# Patient Record
Sex: Female | Born: 1960
Health system: Southern US, Community
[De-identification: ages and names within clinical notes are randomized; demographics above are authoritative.]

## PROBLEM LIST (undated history)

## (undated) DIAGNOSIS — E669 Obesity, unspecified: Secondary | ICD-10-CM

## (undated) DIAGNOSIS — I10 Essential (primary) hypertension: Secondary | ICD-10-CM

## (undated) DIAGNOSIS — M1990 Unspecified osteoarthritis, unspecified site: Secondary | ICD-10-CM

## (undated) DIAGNOSIS — K219 Gastro-esophageal reflux disease without esophagitis: Secondary | ICD-10-CM

## (undated) DIAGNOSIS — G629 Polyneuropathy, unspecified: Secondary | ICD-10-CM

## (undated) DIAGNOSIS — A048 Other specified bacterial intestinal infections: Secondary | ICD-10-CM

## (undated) DIAGNOSIS — E119 Type 2 diabetes mellitus without complications: Secondary | ICD-10-CM

## (undated) HISTORY — DX: Unspecified osteoarthritis, unspecified site: M19.90

## (undated) HISTORY — DX: Gastro-esophageal reflux disease without esophagitis: K21.9

## (undated) HISTORY — DX: Other specified bacterial intestinal infections: A04.8

## (undated) HISTORY — DX: Obesity, unspecified: E66.9

## (undated) HISTORY — DX: Polyneuropathy, unspecified: G62.9

## (undated) HISTORY — DX: Type 2 diabetes mellitus without complications: E11.9

## (undated) HISTORY — DX: Essential (primary) hypertension: I10

---

## 2002-03-02 HISTORY — PX: REDUCTION MAMMAPLASTY: SUR839

## 2002-03-02 HISTORY — PX: BREAST REDUCTION SURGERY: SHX8

## 2004-03-02 HISTORY — PX: ABDOMINAL HYSTERECTOMY: SHX81

## 2011-11-18 DIAGNOSIS — G8929 Other chronic pain: Secondary | ICD-10-CM | POA: Insufficient documentation

## 2011-11-18 DIAGNOSIS — G43909 Migraine, unspecified, not intractable, without status migrainosus: Secondary | ICD-10-CM | POA: Insufficient documentation

## 2015-01-29 DIAGNOSIS — M170 Bilateral primary osteoarthritis of knee: Secondary | ICD-10-CM | POA: Insufficient documentation

## 2015-02-01 DIAGNOSIS — Z1211 Encounter for screening for malignant neoplasm of colon: Secondary | ICD-10-CM | POA: Insufficient documentation

## 2015-02-01 DIAGNOSIS — Z0000 Encounter for general adult medical examination without abnormal findings: Secondary | ICD-10-CM | POA: Insufficient documentation

## 2015-02-12 DIAGNOSIS — E7800 Pure hypercholesterolemia, unspecified: Secondary | ICD-10-CM | POA: Insufficient documentation

## 2015-09-16 DIAGNOSIS — M545 Low back pain, unspecified: Secondary | ICD-10-CM | POA: Insufficient documentation

## 2015-09-16 DIAGNOSIS — G8929 Other chronic pain: Secondary | ICD-10-CM | POA: Insufficient documentation

## 2016-06-10 DIAGNOSIS — G8929 Other chronic pain: Secondary | ICD-10-CM | POA: Insufficient documentation

## 2017-01-06 DIAGNOSIS — E1140 Type 2 diabetes mellitus with diabetic neuropathy, unspecified: Secondary | ICD-10-CM | POA: Insufficient documentation

## 2018-12-12 ENCOUNTER — Encounter: Payer: Self-pay | Admitting: Internal Medicine

## 2018-12-12 ENCOUNTER — Ambulatory Visit (INDEPENDENT_AMBULATORY_CARE_PROVIDER_SITE_OTHER): Payer: Medicare Other | Admitting: Internal Medicine

## 2018-12-12 ENCOUNTER — Other Ambulatory Visit: Payer: Self-pay

## 2018-12-12 VITALS — BP 153/78 | HR 80 | Temp 98.3°F | Ht 65.0 in | Wt 310.6 lb

## 2018-12-12 DIAGNOSIS — G629 Polyneuropathy, unspecified: Secondary | ICD-10-CM | POA: Diagnosis not present

## 2018-12-12 DIAGNOSIS — G4700 Insomnia, unspecified: Secondary | ICD-10-CM | POA: Insufficient documentation

## 2018-12-12 DIAGNOSIS — E1142 Type 2 diabetes mellitus with diabetic polyneuropathy: Secondary | ICD-10-CM

## 2018-12-12 DIAGNOSIS — I10 Essential (primary) hypertension: Secondary | ICD-10-CM

## 2018-12-12 DIAGNOSIS — M1990 Unspecified osteoarthritis, unspecified site: Secondary | ICD-10-CM | POA: Diagnosis not present

## 2018-12-12 DIAGNOSIS — F5101 Primary insomnia: Secondary | ICD-10-CM

## 2018-12-12 DIAGNOSIS — E119 Type 2 diabetes mellitus without complications: Secondary | ICD-10-CM | POA: Insufficient documentation

## 2018-12-12 LAB — POCT GLYCOSYLATED HEMOGLOBIN (HGB A1C): Hemoglobin A1C: 8.3 % — AB (ref 4.0–5.6)

## 2018-12-12 LAB — GLUCOSE, CAPILLARY: Glucose-Capillary: 135 mg/dL — ABNORMAL HIGH (ref 70–99)

## 2018-12-12 NOTE — Patient Instructions (Signed)
Thank you for allowing [place] to provide your care. Today we are checking some blood work to ensure you are on the proper medication and that your diabetes is well controlled. I will call you if we need to make any changes.   I have sent out refills for all your medications to the pharmacy. Continue to work on cutting out pop and sweet from your diet. Weight loss will be the most beneficial thing for your health. Please come back in 3 months or sooner if any issues arise.

## 2018-12-12 NOTE — Assessment & Plan Note (Addendum)
HPI:  Patient diagnoses with HTN in 2017. She has since been on Amlodipine 10 gm and Metoprolol succinate 100 mg QD. She is not having any apparent side effects from the medications. Denies orthostatic symptoms.   She brought in her paperwork today. Last lab work 1 year prior with normal renal function.   A/P: - Check BMP, CBC, UA, and protein to creatinine ratio  - Continue Amlodipine 10 mg QD and Metoprolol succinate 100 mg QD  ADDENDUM: Renal function normal. UA with trace proteinuria. Will await microalbuminuria to decide between ACE/ARB or thiazide diuretic

## 2018-12-12 NOTE — Progress Notes (Signed)
   CC: DM, HTN, Neuropathy, HLD, Obesity   HPI:  [name] is a 58 y.o. female with PMHx listed below presenting for DM, HTN, Neuropathy, HLD, Obesity. Please see the A&P for the status of the patient's chronic medical problems.  Past Medical History:  Diagnosis Date  . Arthritis   . Diabetes ([place])   . Hypertension   . Neuropathy     Past Surgical History:  Procedure Laterality Date  . ABDOMINAL HYSTERECTOMY  2006  . BREAST REDUCTION SURGERY  2004   Family History  Problem Relation Age of Onset  . Diabetes Mother   . Heart failure Mother   . CAD Mother   . Kidney disease Mother   . 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  No prior drug use.   Health Maintenance: Never had a colonoscopy. Previous FIT testing in 2017.  Cervix removed with hysterectomy. No paps since that time.  Last mammogram 2019. Normal   Review of Systems: Performed and all others negative.  Physical Exam: Vitals:   12/12/18 1526  BP: (!) 153/78  Pulse: 80  Temp: 98.3 F (36.8 C)  TempSrc: Oral  SpO2: 98%  Weight: (!) 310 lb 9.6 oz (140.9 kg)  Height: 5\' 5"  (1.651 m)   General: Obese female in no acute distress HENT: Normocephalic, atraumatic, moist mucus membranes Pulm: Good air movement with no wheezing or crackles  CV: RRR, no murmurs, no rubs  Abdomen: Active bowel sounds, soft, non-distended, no tenderness to palpation  Extremities: Pulses palpable in all extremities, no LE edema  Skin: Warm and dry  Neuro: Alert and oriented x 3  Assessment & Plan:   See Encounters Tab for problem based charting.  Patient discussed with Dr. Rebeca Alert

## 2018-12-13 ENCOUNTER — Telehealth: Payer: Self-pay | Admitting: Internal Medicine

## 2018-12-13 ENCOUNTER — Encounter: Payer: Self-pay | Admitting: Internal Medicine

## 2018-12-13 LAB — URINALYSIS, ROUTINE W REFLEX MICROSCOPIC
Bilirubin, UA: NEGATIVE
Glucose, UA: NEGATIVE
Ketones, UA: NEGATIVE
Nitrite, UA: NEGATIVE
RBC, UA: NEGATIVE
Specific Gravity, UA: 1.027 (ref 1.005–1.030)
Urobilinogen, Ur: 0.2 mg/dL (ref 0.2–1.0)
pH, UA: 5 (ref 5.0–7.5)

## 2018-12-13 LAB — CBC WITH DIFFERENTIAL/PLATELET
Basophils Absolute: 0.1 10*3/uL (ref 0.0–0.2)
Basos: 1 %
EOS (ABSOLUTE): 0.2 10*3/uL (ref 0.0–0.4)
Eos: 2 %
Hematocrit: 43 % (ref 34.0–46.6)
Hemoglobin: 14.3 g/dL (ref 11.1–15.9)
Immature Grans (Abs): 0.1 10*3/uL (ref 0.0–0.1)
Immature Granulocytes: 1 %
Lymphocytes Absolute: 4.6 10*3/uL — ABNORMAL HIGH (ref 0.7–3.1)
Lymphs: 45 %
MCH: 27.3 pg (ref 26.6–33.0)
MCHC: 33.3 g/dL (ref 31.5–35.7)
MCV: 82 fL (ref 79–97)
Monocytes Absolute: 0.7 10*3/uL (ref 0.1–0.9)
Monocytes: 6 %
Neutrophils Absolute: 4.6 10*3/uL (ref 1.4–7.0)
Neutrophils: 45 %
Platelets: 276 10*3/uL (ref 150–450)
RBC: 5.24 x10E6/uL (ref 3.77–5.28)
RDW: 16.6 % — ABNORMAL HIGH (ref 11.7–15.4)
WBC: 10.2 10*3/uL (ref 3.4–10.8)

## 2018-12-13 LAB — LIPID PANEL
Chol/HDL Ratio: 2.9 ratio (ref 0.0–4.4)
Cholesterol, Total: 197 mg/dL (ref 100–199)
HDL: 67 mg/dL (ref >39)
LDL Chol Calc (NIH): 88 mg/dL (ref 0–99)
Triglycerides: 253 mg/dL — ABNORMAL HIGH (ref 0–149)
VLDL Cholesterol Cal: 42 mg/dL — ABNORMAL HIGH (ref 5–40)

## 2018-12-13 LAB — BMP8+ANION GAP
Anion Gap: 16 mmol/L (ref 10.0–18.0)
BUN/Creatinine Ratio: 20 (ref 9–23)
BUN: 12 mg/dL (ref 6–24)
CO2: 22 mmol/L (ref 20–29)
Calcium: 9.9 mg/dL (ref 8.7–10.2)
Chloride: 102 mmol/L (ref 96–106)
Creatinine, Ser: 0.59 mg/dL (ref 0.57–1.00)
GFR calc Af Amer: 117 mL/min/{1.73_m2} (ref >59)
GFR calc non Af Amer: 101 mL/min/{1.73_m2} (ref >59)
Glucose: 129 mg/dL — ABNORMAL HIGH (ref 65–99)
Potassium: 4.1 mmol/L (ref 3.5–5.2)
Sodium: 140 mmol/L (ref 134–144)

## 2018-12-13 LAB — MICROALBUMIN / CREATININE URINE RATIO
Creatinine, Urine: 186.5 mg/dL
Microalb/Creat Ratio: 15 mg/g creat (ref 0–29)
Microalbumin, Urine: 28.4 ug/mL

## 2018-12-13 LAB — MICROSCOPIC EXAMINATION
Casts: NONE SEEN /lpf
WBC, UA: >30 /hpf — AB (ref 0–5)

## 2018-12-13 NOTE — Telephone Encounter (Signed)
Pls contact pt regarding referral, results; pt contact [phone]

## 2018-12-13 NOTE — Assessment & Plan Note (Signed)
HPI:  Patient diagnosed with DM in 2017. She is currently only on metformin 1000 mg BID. Previous A1c at 71. 1 year prior. She drinks a lot of soda, typically 1-2 per day. Previously she drank 4-5 per day. She enjoys sweets and has not modified her diet. She does not exercise due to joint pain.   A/P: - Uncontrolled, A1c 8.1. Ideally would start GLP-1 for weight loss but patient is reluctant to injections. Will start SGLT-2 inhibitor  - Would benefit from statin therapy  - Checking urine microalbumin to determine need for ACE/ARB  - Needs optho exam

## 2018-12-13 NOTE — Assessment & Plan Note (Signed)
HPI:  Diagnosed with diabetic neuropathy 1 year after DM diagnosis. Previously on gabapentin. Pain does not limit her daily functioning. She would like to restart gabapentin   A/P: - Restart Gabapentin 300 mg QHS

## 2018-12-13 NOTE — Assessment & Plan Note (Signed)
HPI:  Difficulty with initiation of sleep and nocturia which prevents restful sleep. She drinks a lot of fluid prior to bed. Has been trying melatoin. Previously prescribe trazodone and would like to restart it.   A/P: - Discussed lifestyle modification for insomnia including limiting fluids 2-3 hours prior to bedtime  - Continue melatonin. Hold off on trazodone since we are starting gabapentin for her neuropathy

## 2018-12-13 NOTE — Assessment & Plan Note (Signed)
HPI:  Patient with severe obesity. She is not monitoring her diet and does not exercise. She is aware of the changes she needs to make.   A/P: - Discussed benefits of weight loss  - Discussed lifestyle changes  - Discussed medication augmentation. She is not interested in medications currently

## 2018-12-14 ENCOUNTER — Telehealth: Payer: Self-pay

## 2018-12-14 DIAGNOSIS — E1142 Type 2 diabetes mellitus with diabetic polyneuropathy: Secondary | ICD-10-CM

## 2018-12-14 DIAGNOSIS — M1990 Unspecified osteoarthritis, unspecified site: Secondary | ICD-10-CM

## 2018-12-14 DIAGNOSIS — G629 Polyneuropathy, unspecified: Secondary | ICD-10-CM

## 2018-12-14 NOTE — Telephone Encounter (Signed)
Pt states there's no meds for her at the pharmacy. Pt was here for office visit 12/12/2018. Please call pt back.   Also requesting lab results.

## 2018-12-15 ENCOUNTER — Telehealth: Payer: Self-pay | Admitting: Internal Medicine

## 2018-12-15 DIAGNOSIS — E1142 Type 2 diabetes mellitus with diabetic polyneuropathy: Secondary | ICD-10-CM

## 2018-12-15 MED ORDER — GABAPENTIN 300 MG PO CAPS: 300.0000 mg | ORAL_CAPSULE | Freq: Every day | ORAL | 0 refills | Status: DC

## 2018-12-15 MED ORDER — METFORMIN HCL 1000 MG PO TABS: 1000.0000 mg | ORAL_TABLET | Freq: Two times a day (BID) | ORAL | 3 refills | Status: DC

## 2018-12-15 MED ORDER — CANAGLIFLOZIN 100 MG PO TABS: 100.0000 mg | ORAL_TABLET | Freq: Every day | ORAL | 0 refills | Status: DC

## 2018-12-15 NOTE — Telephone Encounter (Signed)
Referral placed. Thank you

## 2018-12-15 NOTE — Telephone Encounter (Signed)
Pls requesting a nurse to callback [phone]

## 2018-12-15 NOTE — Assessment & Plan Note (Signed)
Patient's hemoglobin A1c came back at 8.3. It is increased from 7.2. We will start Invokona 100 mg QD. If she tolerates this medication we can then increase it to 300 mg daily at her next visit. She will continue to take metformin 1000 mg BID.

## 2018-12-15 NOTE — Progress Notes (Signed)
Internal Medicine Clinic Attending  Case discussed with Dr. Helberg at the time of the visit.  We reviewed the resident's history and exam and pertinent patient test results.  I agree with the assessment, diagnosis, and plan of care documented in the resident's note.  Alexander Raines, M.D., Ph.D.  

## 2018-12-15 NOTE — Telephone Encounter (Signed)
Called patient to discuss her lab results. Her A1c has increased from 7.2 to 8.3. She was reluctant to injection medications and we will therefore start an SGLT2 inhibitor. I have sent out a prescription for invokona. In addition we discussed her UA results that should polyuria with calcium oxalate crystals. She is never had an issue with kidney stones before. We discussed preventative options including low sodium diet and taking potassium supplementation. He will come back in three months for further evaluation.  Ina Homes, MD IMTS PGY3

## 2018-12-15 NOTE — Telephone Encounter (Signed)
Pt calls and states she needs referral to podiatrist.

## 2018-12-20 NOTE — Telephone Encounter (Signed)
addressed

## 2018-12-26 ENCOUNTER — Other Ambulatory Visit: Payer: Self-pay | Admitting: Internal Medicine

## 2018-12-26 DIAGNOSIS — I10 Essential (primary) hypertension: Secondary | ICD-10-CM

## 2018-12-26 DIAGNOSIS — Z1231 Encounter for screening mammogram for malignant neoplasm of breast: Secondary | ICD-10-CM

## 2018-12-26 NOTE — Telephone Encounter (Signed)
Called pt - stated the doctor started her on a new med for blood pressure, Invokana - informed this is for diabetes. Then stated she will need a refill on Amlodipine and Metoprolol. Thanks

## 2018-12-26 NOTE — Telephone Encounter (Signed)
Pls is requesting a callback regarding medicine; [phone]

## 2018-12-27 ENCOUNTER — Ambulatory Visit (INDEPENDENT_AMBULATORY_CARE_PROVIDER_SITE_OTHER): Payer: Medicare Other

## 2018-12-27 ENCOUNTER — Ambulatory Visit (INDEPENDENT_AMBULATORY_CARE_PROVIDER_SITE_OTHER): Payer: Medicare Other | Admitting: Podiatry

## 2018-12-27 ENCOUNTER — Encounter: Payer: Self-pay | Admitting: Podiatry

## 2018-12-27 ENCOUNTER — Other Ambulatory Visit: Payer: Self-pay

## 2018-12-27 DIAGNOSIS — M722 Plantar fascial fibromatosis: Secondary | ICD-10-CM

## 2018-12-27 DIAGNOSIS — B351 Tinea unguium: Secondary | ICD-10-CM | POA: Diagnosis not present

## 2018-12-27 MED ORDER — MELOXICAM 15 MG PO TABS: 15.0000 mg | ORAL_TABLET | Freq: Every day | ORAL | 0 refills | Status: DC

## 2018-12-27 MED ORDER — CLOTRIMAZOLE-BETAMETHASONE 1-0.05 % EX CREA: 1.0000 "application " | TOPICAL_CREAM | Freq: Two times a day (BID) | CUTANEOUS | 0 refills | Status: DC

## 2018-12-27 MED ORDER — AMLODIPINE BESYLATE 10 MG PO TABS: 10.0000 mg | ORAL_TABLET | Freq: Every day | ORAL | 3 refills | Status: DC

## 2018-12-27 NOTE — Patient Instructions (Signed)

## 2018-12-28 ENCOUNTER — Other Ambulatory Visit: Payer: Self-pay | Admitting: *Deleted

## 2018-12-28 DIAGNOSIS — I10 Essential (primary) hypertension: Secondary | ICD-10-CM

## 2018-12-28 MED ORDER — METOPROLOL SUCCINATE ER 100 MG PO TB24: 100.0000 mg | ORAL_TABLET | Freq: Every day | ORAL | 3 refills | Status: DC

## 2018-12-31 NOTE — Progress Notes (Signed)
Subjective:   Patient ID: Ruth Blackburn, female   DOB: 58 y.o.   MRN: [id number]   HPI 58 year old female presents the office today for concerns of left heel pain.  She states this has been ongoing for last 2 months and she describes throbbing on the bottom of her heel.  She states it hurts more after being on it when the morning she has been sitting for some time and stands back up.  She denies any recent treatment and denies any recent injury or trauma.  Also she states that she removed both of her big toenails herself.  The right one came in better however the left side is come back in somewhat thickened discolored on the medial aspect and will itch occasionally.  She is diabetic and last A1c was 8.2.  She denies any claudication symptoms.  No history of ulceration.   Review of Systems  All other systems reviewed and are negative.  Past Medical History:  Diagnosis Date  . Arthritis   . Diabetes ([place])   . Hypertension   . Neuropathy     Past Surgical History:  Procedure Laterality Date  . ABDOMINAL HYSTERECTOMY  2006  . BREAST REDUCTION SURGERY  2004     Current Outpatient Medications:  .  amLODipine (NORVASC) 10 MG tablet, Take by mouth., Disp: , Rfl:  .  aspirin 81 MG chewable tablet, Place 1 Tab into mouth, chew and swallow once daily, Disp: , Rfl:  .  atorvastatin (LIPITOR) 20 MG tablet, , Disp: , Rfl:  .  cyclobenzaprine (FLEXERIL) 10 MG tablet, Take by mouth., Disp: , Rfl:  .  diclofenac (VOLTAREN) 75 MG EC tablet, Take by mouth., Disp: , Rfl:  .  gabapentin (NEURONTIN) 300 MG capsule, Take by mouth., Disp: , Rfl:  .  hydrochlorothiazide (HYDRODIURIL) 25 MG tablet, , Disp: , Rfl:  .  ibuprofen (ADVIL) 600 MG tablet, Take by mouth., Disp: , Rfl:  .  lisinopril (ZESTRIL) 10 MG tablet, Take by mouth., Disp: , Rfl:  .  metFORMIN (GLUCOPHAGE) 500 MG tablet, , Disp: , Rfl:  .  metoprolol succinate (TOPROL-XL) 100 MG 24 hr tablet, TAKE 1 TABLET BY MOUTH ONE TIME A DAY,  Disp: , Rfl:  .  SUMAtriptan (IMITREX) 50 MG tablet, Take by mouth., Disp: , Rfl:  .  terbinafine (LAMISIL) 250 MG tablet, Take by mouth., Disp: , Rfl:  .  traMADol (ULTRAM) 50 MG tablet, Take by mouth., Disp: , Rfl:  .  traZODone (DESYREL) 50 MG tablet, Take by mouth., Disp: , Rfl:  .  amLODipine (NORVASC) 10 MG tablet, Take 1 tablet (10 mg total) by mouth daily., Disp: 90 tablet, Rfl: 3 .  canagliflozin (INVOKANA) 100 MG TABS tablet, Take 1 tablet (100 mg total) by mouth daily before breakfast., Disp: 90 tablet, Rfl: 0 .  clotrimazole-betamethasone (LOTRISONE) cream, Apply 1 application topically 2 (two) times daily., Disp: 30 g, Rfl: 0 .  diclofenac (VOLTAREN) 75 MG EC tablet, , Disp: , Rfl:  .  gabapentin (NEURONTIN) 300 MG capsule, Take 1 capsule (300 mg total) by mouth at bedtime., Disp: 90 capsule, Rfl: 0 .  meloxicam (MOBIC) 15 MG tablet, Take 1 tablet (15 mg total) by mouth daily., Disp: 30 tablet, Rfl: 0 .  metFORMIN (GLUCOPHAGE) 1000 MG tablet, Take 1 tablet (1,000 mg total) by mouth 2 (two) times daily with a meal., Disp: 180 tablet, Rfl: 3 .  metoprolol succinate (TOPROL-XL) 100 MG 24 hr tablet, Take 1 tablet (100 mg  total) by mouth daily. Take with or immediately following a meal., Disp: 90 tablet, Rfl: 3 .  metoprolol tartrate (LOPRESSOR) 100 MG tablet, , Disp: , Rfl:   Not on File       Objective:  Physical Exam  General: AAO x3, NAD  Dermatological: On the medial aspect of the left hallux toenail is thickening of the nail as well as yellow and brown discoloration.  No pain at the area at this time.  Occasionally she does get discomfort.  She describes more of an itching sensation around the nail.      Vascular: Dorsalis Pedis artery and Posterior Tibial artery pedal pulses are 2/4 bilateral with immedate capillary fill time. There is no pain with calf compression, swelling, warmth, erythema.   Neruologic: Grossly intact via light touch bilateral.    Musculoskeletal:  Tenderness to palpation along the plantar medial tubercle of the calcaneus at the insertion of plantar fascia on the left foot. There is no pain along the course of the plantar fascia within the arch of the foot. Plantar fascia appears to be intact. There is no pain with lateral compression of the calcaneus or pain with vibratory sensation. There is no pain along the course or insertion of the achilles tendon. No other areas of tenderness to bilateral lower extremities.Muscular strength 5/5 in all groups tested bilateral.  Gait: Unassisted, Nonantalgic.       Assessment:   Left heel pain, plantar fasciitis with likely onychomycosis    Plan:  -Treatment options discussed including all alternatives, risks, and complications -Etiology of symptoms were discussed  1.  Left heel pain, plan fasciitis -X-rays obtained reviewed.  Evidence of acute fracture identified today.  Discussed steroid injection today.  Prescribed meloxicam discussed side effects of medication.  Hold other anti-inflammatories.  Discussed stretching, icing daily.  Plantar fascial brace dispensed.  2.  Left toenail onychomycosis -Nails debrided and sent for culture, pathology to [facility] labs -We will start Lotrisone which ordered today.    Trula Slade DPM

## 2019-01-19 ENCOUNTER — Telehealth: Payer: Self-pay | Admitting: *Deleted

## 2019-01-19 DIAGNOSIS — Z79899 Other long term (current) drug therapy: Secondary | ICD-10-CM

## 2019-01-19 NOTE — Telephone Encounter (Signed)
I informed pt of Dr. Leigh Aurora review of results and orders, that if she wanted to use the oral lamisil she would have labs with [facility] [address] and once results had returned we would call with instructions. Pt states understanding.

## 2019-01-19 NOTE — Telephone Encounter (Signed)
-----   Message from Trula Slade, DPM sent at 01/13/2019 10:31 AM EST ----- Val- can you please let her know that the culture did show fungus. Would recommend oral Lamisil again is she is open to it but would need to check CBC and LFT prior. If she does not want to do oral we can do a topical [place].

## 2019-01-21 LAB — CBC WITH DIFFERENTIAL/PLATELET
Absolute Monocytes: 798 cells/uL (ref 200–950)
Basophils Absolute: 48 cells/uL (ref 0–200)
Basophils Relative: 0.5 %
Eosinophils Absolute: 190 cells/uL (ref 15–500)
Eosinophils Relative: 2 %
HCT: 42.7 % (ref 35.0–45.0)
Hemoglobin: 13.8 g/dL (ref 11.7–15.5)
Lymphs Abs: 4209 cells/uL — ABNORMAL HIGH (ref 850–3900)
MCH: 26.7 pg — ABNORMAL LOW (ref 27.0–33.0)
MCHC: 32.3 g/dL (ref 32.0–36.0)
MCV: 82.6 fL (ref 80.0–100.0)
MPV: 10.5 fL (ref 7.5–12.5)
Monocytes Relative: 8.4 %
Neutro Abs: 4256 cells/uL (ref 1500–7800)
Neutrophils Relative %: 44.8 %
Platelets: 246 10*3/uL (ref 140–400)
RBC: 5.17 10*6/uL — ABNORMAL HIGH (ref 3.80–5.10)
RDW: 15.7 % — ABNORMAL HIGH (ref 11.0–15.0)
Total Lymphocyte: 44.3 %
WBC: 9.5 10*3/uL (ref 3.8–10.8)

## 2019-01-21 LAB — HEPATIC FUNCTION PANEL
AG Ratio: 1.3 (calc) (ref 1.0–2.5)
ALT: 30 U/L — ABNORMAL HIGH (ref 6–29)
AST: 30 U/L (ref 10–35)
Albumin: 4.3 g/dL (ref 3.6–5.1)
Alkaline phosphatase (APISO): 66 U/L (ref 37–153)
Bilirubin, Direct: 0.1 mg/dL (ref 0.0–0.2)
Globulin: 3.2 g/dL (calc) (ref 1.9–3.7)
Indirect Bilirubin: 0.3 mg/dL (calc) (ref 0.2–1.2)
Total Bilirubin: 0.4 mg/dL (ref 0.2–1.2)
Total Protein: 7.5 g/dL (ref 6.1–8.1)

## 2019-01-24 ENCOUNTER — Other Ambulatory Visit: Payer: Self-pay

## 2019-01-24 ENCOUNTER — Ambulatory Visit (INDEPENDENT_AMBULATORY_CARE_PROVIDER_SITE_OTHER): Payer: Medicare Other | Admitting: Podiatry

## 2019-01-24 DIAGNOSIS — M722 Plantar fascial fibromatosis: Secondary | ICD-10-CM

## 2019-01-24 DIAGNOSIS — B351 Tinea unguium: Secondary | ICD-10-CM | POA: Diagnosis not present

## 2019-01-24 MED ORDER — FLUCONAZOLE 150 MG PO TABS: 150.0000 mg | ORAL_TABLET | ORAL | 0 refills | Status: DC

## 2019-01-24 NOTE — Patient Instructions (Signed)
Fluconazole tablets What is this medicine? FLUCONAZOLE (floo KON na zole) is an antifungal medicine. It is used to treat certain kinds of fungal or yeast infections. This medicine may be used for other purposes; ask your health care provider or pharmacist if you have questions. COMMON BRAND NAME(S): Diflucan What should I tell my health care provider before I take this medicine? They need to know if you have any of these conditions:  history of irregular heart beat  kidney disease  an unusual or allergic reaction to fluconazole, other azole antifungals, medicines, foods, dyes, or preservatives  pregnant or trying to get pregnant  breast-feeding How should I use this medicine? Take this medicine by mouth. Follow the directions on the prescription label. Do not take your medicine more often than directed. Talk to your pediatrician regarding the use of this medicine in children. Special care may be needed. This medicine has been used in children as young as 29 months of age. Overdosage: If you think you have taken too much of this medicine contact a poison control center or emergency room at once. NOTE: This medicine is only for you. Do not share this medicine with others. What if I miss a dose? If you miss a dose, take it as soon as you can. If it is almost time for your next dose, take only that dose. Do not take double or extra doses. What may interact with this medicine? Do not take this medicine with any of the following medications:  astemizole  certain medicines for irregular heart beat like dronedarone, quinidine  cisapride  erythromycin  lomitapide  other medicines that prolong the QT interval (cause an abnormal heart rhythm)  pimozide  terfenadine  thioridazine  tolvaptan This medicine may also interact with the following medications:  antiviral medicines for HIV or AIDS  birth control pills  certain antibiotics like rifabutin, rifampin  certain medicines  for blood pressure like amlodipine, isradipine, felodipine, hydrochlorothiazide, losartan, nifedipine  certain medicines for cancer like cyclophosphamide, vinblastine, vincristine  certain medicines for cholesterol like atorvastatin, lovastatin, fluvastatin, simvastatin  certain medicines for depression, anxiety, or psychotic disturbances like amitriptyline, midazolam, nortriptyline, triazolam  certain medicines for diabetes like glipizide, glyburide, tolbutamide  certain medicines for pain like alfentanil, fentanyl, methadone  certain medicines for seizures like carbamazepine, phenytoin  certain medicines that treat or prevent blood clots like warfarin  dofetilide  halofantrine  medicines that lower your chance of fighting infection like cyclosporine, prednisone, tacrolimus  NSAIDS, medicines for pain and inflammation, like celecoxib, diclofenac, flurbiprofen, ibuprofen, meloxicam, naproxen  other medicines for fungal infections  sirolimus  theophylline  tofacitinib  ziprasidone This list may not describe all possible interactions. Give your health care provider a list of all the medicines, herbs, non-prescription drugs, or dietary supplements you use. Also tell them if you smoke, drink alcohol, or use illegal drugs. Some items may interact with your medicine. What should I watch for while using this medicine? Visit your doctor or health care professional for regular checkups. If you are taking this medicine for a long time you may need blood work. Tell your doctor if your symptoms do not improve. Some fungal infections need many weeks or months of treatment to cure. Alcohol can increase possible damage to your liver. Avoid alcoholic drinks. If you have a vaginal infection, do not have sex until you have finished your treatment. You can wear a sanitary napkin. Do not use tampons. Wear freshly washed cotton, not synthetic, panties. What side effects may  I notice from receiving  this medicine? Side effects that you should report to your doctor or health care professional as soon as possible:  allergic reactions like skin rash or itching, hives, swelling of the lips, mouth, tongue, or throat  dark urine  feeling dizzy or faint  irregular heartbeat or chest pain  redness, blistering, peeling or loosening of the skin, including inside the mouth  trouble breathing  unusual bruising or bleeding  vomiting  yellowing of the eyes or skin Side effects that usually do not require medical attention (report to your doctor or health care professional if they continue or are bothersome):  changes in how food tastes  diarrhea  headache  stomach upset or nausea This list may not describe all possible side effects. Call your doctor for medical advice about side effects. You may report side effects to FDA at 1-800-FDA-1088. Where should I keep my medicine? Keep out of the reach of children. Store at room temperature below 30 degrees C (86 degrees F). Throw away any medicine after the expiration date. NOTE: This sheet is a summary. It may not cover all possible information. If you have questions about this medicine, talk to your doctor, pharmacist, or health care provider.  2020 Elsevier/Gold Standard (2018-02-07 11:58:26)

## 2019-01-25 ENCOUNTER — Telehealth: Payer: Self-pay | Admitting: *Deleted

## 2019-01-25 MED ORDER — NONFORMULARY OR COMPOUNDED ITEM: 11 refills | Status: DC

## 2019-01-25 NOTE — Telephone Encounter (Signed)
Dr. Jacqualyn Posey order [facility] Antifungal topical and I faxed orders with demographics.

## 2019-02-01 DIAGNOSIS — B351 Tinea unguium: Secondary | ICD-10-CM | POA: Insufficient documentation

## 2019-02-01 DIAGNOSIS — M722 Plantar fascial fibromatosis: Secondary | ICD-10-CM | POA: Insufficient documentation

## 2019-02-01 NOTE — Progress Notes (Signed)
Subjective: 58 year old female presents the office today for follow-up evaluation of toenail fungus and discussed nail culture results and also for follow-up evaluation of plantar fasciitis.  She states the plantar fasciitis is about the same.  The injection did not do much.  She still gets occasional sharp pain from the heel.  She is doing the stretching as well as anti-inflammatories. Denies any systemic complaints such as fevers, chills, nausea, vomiting. No acute changes since last appointment, and no other complaints at this time.   Objective: AAO x3, NAD DP/PT pulses palpable bilaterally, CRT less than 3 seconds Over the toenail is unchanged.  Along the medial aspect left hallux toenail is thickened discolored with yellow-brown discoloration.  No hyperpigmented changes.  No edema, erythema. There is tenderness palpation on plantar medial tubercle of the calcaneus and insertion of plantar fascia.  Plantar fascial appears to be intact.  No pain with compression of calcaneus.  Achilles tendon appears to be intact. No open lesions or pre-ulcerative lesions.  No pain with calf compression, swelling, warmth, erythema  Assessment: Onychomycosis, plantar fasciitis  Plan: -All treatment options discussed with the patient including all alternatives, risks, complications.  -Discussed nail culture results with her.  Her ALT is 30 which is very minimally elevated.  However given the risk of Lamisil would hold off on this.  Prescribed fluconazole to take once weekly.  Will order compound cream for count apothecary. -Regards to the plantar fasciitis we discussed stretching, icing.  Ultimately the change in shoes and inserts as can be helpful for her.  She is known to this.  Continue anti-inflammatories as needed.  If no improvement plan fasciitis in the next couple weeks and let me know.  Otherwise I will see her back in 3 months. -Patient encouraged to call the office with any questions, concerns, change  in symptoms.   Trula Slade DPM

## 2019-02-13 ENCOUNTER — Other Ambulatory Visit: Payer: Self-pay

## 2019-02-13 ENCOUNTER — Ambulatory Visit
Admission: RE | Admit: 2019-02-13 | Discharge: 2019-02-13 | Disposition: A | Discharge status: Home / Self Care | Payer: Medicare Other | Source: Ambulatory Visit | Attending: Nurse Practitioner | Admitting: Nurse Practitioner

## 2019-02-13 DIAGNOSIS — Z1231 Encounter for screening mammogram for malignant neoplasm of breast: Secondary | ICD-10-CM

## 2019-03-29 ENCOUNTER — Ambulatory Visit (HOSPITAL_COMMUNITY)
Admission: RE | Admit: 2019-03-29 | Discharge: 2019-03-29 | Disposition: A | Discharge status: Home / Self Care | Payer: Medicare Other | Source: Ambulatory Visit | Attending: Internal Medicine | Admitting: Internal Medicine

## 2019-03-29 DIAGNOSIS — R079 Chest pain, unspecified: Secondary | ICD-10-CM | POA: Diagnosis present

## 2019-04-27 ENCOUNTER — Ambulatory Visit (HOSPITAL_COMMUNITY)
Admission: RE | Admit: 2019-04-27 | Discharge: 2019-04-27 | Disposition: A | Discharge status: Home / Self Care | Payer: Medicare Other | Source: Ambulatory Visit

## 2019-04-27 ENCOUNTER — Encounter: Payer: Self-pay | Admitting: Internal Medicine

## 2019-04-27 ENCOUNTER — Other Ambulatory Visit: Payer: Self-pay

## 2019-04-27 ENCOUNTER — Ambulatory Visit (INDEPENDENT_AMBULATORY_CARE_PROVIDER_SITE_OTHER): Payer: Medicare Other | Admitting: Internal Medicine

## 2019-04-27 ENCOUNTER — Ambulatory Visit (HOSPITAL_COMMUNITY)
Admission: RE | Admit: 2019-04-27 | Discharge: 2019-04-27 | Disposition: A | Discharge status: Home / Self Care | Payer: Medicare Other | Source: Ambulatory Visit | Attending: Student in an Organized Health Care Education/Training Program | Admitting: Student in an Organized Health Care Education/Training Program

## 2019-04-27 VITALS — BP 156/75 | HR 74 | Temp 98.3°F | Ht 66.0 in | Wt 312.3 lb

## 2019-04-27 DIAGNOSIS — I10 Essential (primary) hypertension: Secondary | ICD-10-CM | POA: Diagnosis present

## 2019-04-27 DIAGNOSIS — R079 Chest pain, unspecified: Secondary | ICD-10-CM

## 2019-04-27 DIAGNOSIS — E1142 Type 2 diabetes mellitus with diabetic polyneuropathy: Secondary | ICD-10-CM | POA: Diagnosis not present

## 2019-04-27 DIAGNOSIS — R300 Dysuria: Secondary | ICD-10-CM | POA: Diagnosis not present

## 2019-04-27 LAB — BASIC METABOLIC PANEL
Anion gap: 9 (ref 5–15)
BUN: 6 mg/dL (ref 6–20)
CO2: 27 mmol/L (ref 22–32)
Calcium: 8.8 mg/dL — ABNORMAL LOW (ref 8.9–10.3)
Chloride: 103 mmol/L (ref 98–111)
Creatinine, Ser: 0.57 mg/dL (ref 0.44–1.00)
GFR calc Af Amer: >60 mL/min (ref >60)
GFR calc non Af Amer: >60 mL/min (ref >60)
Glucose, Bld: 116 mg/dL — ABNORMAL HIGH (ref 70–99)
Potassium: 3.3 mmol/L — ABNORMAL LOW (ref 3.5–5.1)
Sodium: 139 mmol/L (ref 135–145)

## 2019-04-27 LAB — POCT URINALYSIS DIPSTICK
Blood, UA: NEGATIVE
Glucose, UA: NEGATIVE
Leukocytes, UA: NEGATIVE
Nitrite, UA: NEGATIVE
Protein, UA: NEGATIVE
Spec Grav, UA: >=1.03 — AB (ref 1.010–1.025)
pH, UA: 5.5 (ref 5.0–8.0)

## 2019-04-27 LAB — POCT GLYCOSYLATED HEMOGLOBIN (HGB A1C): Hemoglobin A1C: 7.6 % — AB (ref 4.0–5.6)

## 2019-04-27 LAB — GLUCOSE, CAPILLARY: Glucose-Capillary: 110 mg/dL — ABNORMAL HIGH (ref 70–99)

## 2019-04-27 MED ORDER — CYCLOBENZAPRINE HCL 10 MG PO TABS: 10.0000 mg | ORAL_TABLET | Freq: Every day | ORAL | 0 refills | Status: AC

## 2019-04-27 MED ORDER — TRAMADOL HCL 50 MG PO TABS: 50.0000 mg | ORAL_TABLET | Freq: Two times a day (BID) | ORAL | 0 refills | Status: DC

## 2019-04-27 NOTE — Progress Notes (Signed)
   CC: Chest pain  HPI:  [name] is a 59 y.o.  female with PMHx listed below presenting for chest pain. Please see the A&P for the status of the patient's chronic medical problems.  Past Medical History:  Diagnosis Date  . Arthritis   . Diabetes ([place])   . Hypertension   . Neuropathy    Review of Systems:  Performed and all others negative.  Physical Exam: Vitals:   04/27/19 1520 04/27/19 1550  BP: (!) 181/89 (!) 156/75  Pulse: 74   Temp: 98.3 F (36.8 C)   TempSrc: Oral   SpO2: 99%   Weight: (!) 312 lb 4.8 oz (141.7 kg)   Height: 5\' 6"  (1.676 m)    General: Obese female in no acute distress Pulm: Good air movement with no wheezing or crackles  CV: RRR, no murmurs, no rubs   Assessment & Plan:   See Encounters Tab for problem based charting.  Patient discussed with Dr. Philipp Ovens

## 2019-04-27 NOTE — Patient Instructions (Signed)
Thank you for allowing [place] to provide your care. Today we are getting an EKG and chest x-ray to evaluate your chest pain. I think that if this is likely related to your lungs or to your muscles in your chest wall. I have sent out two medications. One is called Flexeril. It is a muscle relaxers and can make you very sleepy so please take this at night. The other one is tramadol. It can help with pain you can take this twice daily.  If at any point you feel that your chest pain becomes worse need to seek medical attention immediately.

## 2019-04-28 NOTE — Assessment & Plan Note (Signed)
Patient presents with 7 days of chest pain. Constant in nature. Severe. Worse with cough, deep inspiration, and palpation. No change with exertion. Has not tried any medications to help with the pain. She has never had pain like this before. Her mother had an MI at the age of 28 but no family history of early MI.   EKG sinus rhythm without acute ST segment changes. CXR without acute abnormalities.   A/P: - Pleuritic vs MSK etiology  - Recommended NSAIDs but patient declined  - Set out tramadol and cyclobenzaprine - Return precautions given

## 2019-05-01 NOTE — Progress Notes (Signed)
Internal Medicine Clinic Attending  Case discussed with Dr. Helberg at the time of the visit.  We reviewed the resident's history and exam and pertinent patient test results.  I agree with the assessment, diagnosis, and plan of care documented in the resident's note.    

## 2019-05-02 ENCOUNTER — Other Ambulatory Visit: Payer: Self-pay | Admitting: Internal Medicine

## 2019-05-02 DIAGNOSIS — R079 Chest pain, unspecified: Secondary | ICD-10-CM

## 2019-05-02 MED ORDER — TRAMADOL HCL 50 MG PO TABS: 50.0000 mg | ORAL_TABLET | Freq: Two times a day (BID) | ORAL | 0 refills | Status: DC

## 2019-05-02 NOTE — Telephone Encounter (Signed)
Pt is also requesting a callback

## 2019-05-02 NOTE — Telephone Encounter (Signed)
I am okay refilling her Tramadol for her chronic pain. She will need to be seen within the next 30 days to sign a pain contract.   Thanks, Dr. Tarri Abernethy

## 2019-05-02 NOTE — Telephone Encounter (Signed)
Pt calls and states she is better but will need a refill of tramadol Thursday, she states you told her you would refill. She states she explained before she moved here she had been taking them for several years

## 2019-05-02 NOTE — Telephone Encounter (Signed)
Need refill on traMADol (ULTRAM) 50 MG tablet meloxicam  ;pt contact [place] ([place] - [address]   Pt is wanting to medicine today

## 2019-05-03 ENCOUNTER — Telehealth: Payer: Self-pay | Admitting: Internal Medicine

## 2019-05-03 NOTE — Telephone Encounter (Signed)
Addressed in separate encounter dated 05/03/19 SChaplin, RN,BSN

## 2019-05-03 NOTE — Telephone Encounter (Signed)
TC to patient to inform patient she will need to be seen within the next 30 days to sign a pain contract per Dr. Jerrell Mylar instructions.  VM obtained and message received that VM box was full, nurse unable to leave message. SChaplin, RN,BSN

## 2019-05-03 NOTE — Telephone Encounter (Signed)
Hold off on the meloxicam right now. Can discuss at next visit. Thanks.

## 2019-05-03 NOTE — Telephone Encounter (Signed)
Pt missed call, pls return call [phone]

## 2019-05-03 NOTE — Telephone Encounter (Signed)
Pt informed she will need to be seen within the next 30 days to sign a pain contract for Tramadol per Dr. Jerrell Mylar instructions.  She verbalized understanding and states she will call back next week to schedule.  Pt also states she requested a RX for Meloxicam yesterday, this is not on patients current med list.  Pt states she does not know what dosage she was previously taking, but took this once daily for her arthritis.  Will send to PCP. Thank you, SChaplin, RN,BSN

## 2019-05-04 NOTE — Telephone Encounter (Signed)
TC to patient, VM obtained and message received that VM box has not been set up yet.  RN unable to leave message regarding Dr. Jerrell Mylar instructions r/t meloxicam RX. SChaplin, RN,BSN

## 2019-05-25 ENCOUNTER — Ambulatory Visit (INDEPENDENT_AMBULATORY_CARE_PROVIDER_SITE_OTHER): Payer: Medicare Other | Admitting: Podiatry

## 2019-05-25 ENCOUNTER — Other Ambulatory Visit: Payer: Self-pay

## 2019-05-25 DIAGNOSIS — B351 Tinea unguium: Secondary | ICD-10-CM | POA: Diagnosis not present

## 2019-05-25 DIAGNOSIS — M722 Plantar fascial fibromatosis: Secondary | ICD-10-CM

## 2019-05-25 MED ORDER — FLUCONAZOLE 150 MG PO TABS: 150.0000 mg | ORAL_TABLET | ORAL | 0 refills | Status: DC

## 2019-05-25 NOTE — Patient Instructions (Signed)
Fluconazole tablets What is this medicine? FLUCONAZOLE (floo KON na zole) is an antifungal medicine. It is used to treat certain kinds of fungal or yeast infections. This medicine may be used for other purposes; ask your health care provider or pharmacist if you have questions. COMMON BRAND NAME(S): Diflucan What should I tell my health care provider before I take this medicine? They need to know if you have any of these conditions:  history of irregular heart beat  kidney disease  an unusual or allergic reaction to fluconazole, other azole antifungals, medicines, foods, dyes, or preservatives  pregnant or trying to get pregnant  breast-feeding How should I use this medicine? Take this medicine by mouth. Follow the directions on the prescription label. Do not take your medicine more often than directed. Talk to your pediatrician regarding the use of this medicine in children. Special care may be needed. This medicine has been used in children as young as 6 months of age. Overdosage: If you think you have taken too much of this medicine contact a poison control center or emergency room at once. NOTE: This medicine is only for you. Do not share this medicine with others. What if I miss a dose? If you miss a dose, take it as soon as you can. If it is almost time for your next dose, take only that dose. Do not take double or extra doses. What may interact with this medicine? Do not take this medicine with any of the following medications:  astemizole  certain medicines for irregular heart beat like dronedarone, quinidine  cisapride  erythromycin  lomitapide  other medicines that prolong the QT interval (cause an abnormal heart rhythm)  pimozide  terfenadine  thioridazine This medicine may also interact with the following medications:  antiviral medicines for HIV or AIDS  birth control pills  certain antibiotics like rifabutin, rifampin  certain medicines for blood  pressure like amlodipine, isradipine, felodipine, hydrochlorothiazide, losartan, nifedipine  certain medicines for cancer like cyclophosphamide, ibrutinib, vinblastine, vincristine  certain medicines for cholesterol like atorvastatin, lovastatin, fluvastatin, simvastatin  certain medicines for depression, anxiety, or psychotic disturbances like amitriptyline, midazolam, nortriptyline, triazolam  certain medicines for diabetes like glipizide, glyburide, tolbutamide  certain medicines for pain like alfentanil, fentanyl, methadone  certain medicines for seizures like carbamazepine, phenytoin  certain medicines that treat or prevent blood clots like warfarin  dofetilide  halofantrine  medicines that lower your chance of fighting infection like cyclosporine, prednisone, tacrolimus  NSAIDS, medicines for pain and inflammation, like celecoxib, diclofenac, flurbiprofen, ibuprofen, meloxicam, naproxen  other medicines for fungal infections  sirolimus  theophylline  tofacitinib  tolvaptan  ziprasidone This list may not describe all possible interactions. Give your health care provider a list of all the medicines, herbs, non-prescription drugs, or dietary supplements you use. Also tell them if you smoke, drink alcohol, or use illegal drugs. Some items may interact with your medicine. What should I watch for while using this medicine? Visit your doctor or health care professional for regular checkups. If you are taking this medicine for a long time you may need blood work. Tell your doctor if your symptoms do not improve. Some fungal infections need many weeks or months of treatment to cure. Alcohol can increase possible damage to your liver. Avoid alcoholic drinks. If you have a vaginal infection, do not have sex until you have finished your treatment. You can wear a sanitary napkin. Do not use tampons. Wear freshly washed cotton, not synthetic, panties. What side effects   may I notice  from receiving this medicine? Side effects that you should report to your doctor or health care professional as soon as possible:  allergic reactions like skin rash or itching, hives, swelling of the lips, mouth, tongue, or throat  dark urine  feeling dizzy or faint  irregular heartbeat or chest pain  redness, blistering, peeling or loosening of the skin, including inside the mouth  trouble breathing  unusual bruising or bleeding  vomiting  yellowing of the eyes or skin Side effects that usually do not require medical attention (report to your doctor or health care professional if they continue or are bothersome):  changes in how food tastes  diarrhea  headache  stomach upset or nausea This list may not describe all possible side effects. Call your doctor for medical advice about side effects. You may report side effects to FDA at 1-800-FDA-1088. Where should I keep my medicine? Keep out of the reach of children. Store at room temperature below 30 degrees C (86 degrees F). Throw away any medicine after the expiration date. NOTE: This sheet is a summary. It may not cover all possible information. If you have questions about this medicine, talk to your doctor, pharmacist, or health care provider.  2020 Elsevier/Gold Standard (2018-11-10 11:41:56)  

## 2019-05-29 NOTE — Progress Notes (Signed)
Subjective: 59 year old female presents the office today for follow-up evaluation of onychomycosis.  She was on the fluconazole and she did notice some improvement with this.  She did not get the compound cream due to cost.  She states that in particular fasciitis she is doing somewhat better.  Plantar fascial brace does help. Has not been helpful.  Currently no significant discomfort. Denies any systemic complaints such as fevers, chills, nausea, vomiting. No acute changes since last appointment, and no other complaints at this time.   Objective: AAO x3, NAD DP/PT pulses palpable bilaterally, CRT less than 3 seconds There is slight improvement with color to the medial aspect of the left hallux toenail but is still hypertrophic, dystrophic with yellow-brown discoloration.  There is no pain.   No tenderness palpation on plantar medial tubercle of the calcaneus there is for some plantar fascial.  No pain with lateral compression of calcaneus.  No pain Achilles tendon.  No other areas of discomfort identified today. No open lesions or pre-ulcerative lesions.  No pain with calf compression, swelling, warmth, erythema  Assessment: Onychomycosis, plantar fasciitis  Plan: -All treatment options discussed with the patient including all alternatives, risks, complications.  -Already continue fluconazole.  Reordered this today. -Regards to plantar fasciitis continue stretching, ice exercises daily.  Discussed shoe modifications as well as for inserts.  We held off on steroid injection today. -Patient encouraged to call the office with any questions, concerns, change in symptoms.   Return in about 3 months (around 08/25/2019) for nali fungus .  Trula Slade DPM

## 2019-06-07 ENCOUNTER — Other Ambulatory Visit: Payer: Self-pay | Admitting: Internal Medicine

## 2019-06-07 DIAGNOSIS — E1142 Type 2 diabetes mellitus with diabetic polyneuropathy: Secondary | ICD-10-CM

## 2019-07-06 ENCOUNTER — Encounter: Payer: Self-pay | Admitting: *Deleted

## 2019-08-28 ENCOUNTER — Ambulatory Visit (INDEPENDENT_AMBULATORY_CARE_PROVIDER_SITE_OTHER): Payer: Medicare Other | Admitting: Podiatry

## 2019-08-28 ENCOUNTER — Encounter: Payer: Self-pay | Admitting: Podiatry

## 2019-08-28 ENCOUNTER — Other Ambulatory Visit: Payer: Self-pay

## 2019-08-28 DIAGNOSIS — M722 Plantar fascial fibromatosis: Secondary | ICD-10-CM

## 2019-08-28 DIAGNOSIS — B351 Tinea unguium: Secondary | ICD-10-CM | POA: Diagnosis not present

## 2019-08-28 NOTE — Progress Notes (Signed)
°  Subjective:  Patient ID: Ruth Blackburn, female    DOB: 12-20-1960,  MRN: [id number]  Chief Complaint  Patient presents with   Nail Problem    the left big toenail is still discolored and the medicine did not help and was going to do blood work today and try lamisil    Plantar Fasciitis    the heels are still about the same     59 y.o. female presents with the above complaint.  She still having heel pain, although it is about the same.  She does not think requires an injection today.  She is interested in what type of shoes to be best to wear for the heel pain.  She is taken a few courses of fluconazole for the left hallux nail, has not been super helpful.  She was considering taking a 90-day course of Lamisil with Dr. Jacqualyn Posey, she was hesitant to do this first because her LFTs in November showed a mild elevation in her ALT.  She is interested in take this now she is tired of dealing with the nail fungus.  Review of Systems: Negative except as noted in the HPI. Denies N/V/F/Ch. Objective:  There were no vitals filed for this visit. General AA&O x3. Normal mood and affect.  Vascular Dorsalis pedis and posterior tibial pulses  present 2+ bilaterally  Capillary refill normal to all digits. Pedal hair growth normal.  Neurologic Epicritic sensation grossly present.  Dermatologic No open lesions. Interspaces clear of maceration. Left hallux nail medial one third is discolored thickened and has subungual debris  Orthopedic: MMT 5/5 in dorsiflexion, plantarflexion, inversion, and eversion. Normal joint ROM without pain or crepitus.  Minimal heel pain with palpation bilaterally   Radiology n/a Assessment & Plan:   Encounter Diagnoses  Name Primary?   Onychomycosis Yes   Plantar fasciitis     Patient was evaluated and treated and all questions answered.   Plantar Fasciitis and Onychomycosis -Recommend continued stretching and Vionic shoes for shoe gear  -New LFTs ordered, I will  call her with the results.  If they are normal or similar to last year's results, we will do a 90-day course of terbinafine.  We will recheck her LFTs halfway through the course of terbinafine -Left hallux nail was sharply divided with a nail nipper and smoothed with a rotary bur to remove thickened nail and subungual debris   Lanae Crumbly, DPM 08/28/2019    Return in about 3 months (around 11/28/2019) for with Dr Jacqualyn Posey.

## 2019-08-28 NOTE — Patient Instructions (Addendum)
Look for Vionic shoes, which can be helpful for heel pain or plantar fasciitis    Fungal Nail Infection A fungal nail infection is a common infection of the toenails or fingernails. This condition affects toenails more often than fingernails. It often affects the great, or big, toes. More than one nail may be infected. The condition can be passed from person to person (is contagious). What are the causes? This condition is caused by a fungus. Several types of fungi can cause the infection. These fungi are common in moist and warm areas. If your hands or feet come into contact with the fungus, it may get into a crack in your fingernail or toenail and cause the infection. What increases the risk? The following factors may make you more likely to develop this condition:  Being female.  Being of older age.  Living with someone who has the fungus.  Walking barefoot in areas where the fungus thrives, such as showers or locker rooms.  Wearing shoes and socks that cause your feet to sweat.  Having a nail injury or a recent nail surgery.  Having certain medical conditions, such as: ? Athlete's foot. ? Diabetes. ? Psoriasis. ? Poor circulation. ? A weak body defense system (immune system). What are the signs or symptoms? Symptoms of this condition include:  A pale spot on the nail.  Thickening of the nail.  A nail that becomes yellow or brown.  A brittle or ragged nail edge.  A crumbling nail.  A nail that has lifted away from the nail bed. How is this diagnosed? This condition is diagnosed with a physical exam. Your health care provider may take a scraping or clipping from your nail to test for the fungus. How is this treated? Treatment is not needed for mild infections. If you have significant nail changes, treatment may include:  Antifungal medicines taken by mouth (orally). You may need to take the medicine for several weeks or several months, and you may not see the results  for a long time. These medicines can cause side effects. Ask your health care provider what problems to watch for.  Antifungal nail polish or nail cream. These may be used along with oral antifungal medicines.  Laser treatment of the nail.  Surgery to remove the nail. This may be needed for the most severe infections. It can take a long time, usually up to a year, for the infection to go away. The infection may also come back. Follow these instructions at home: Medicines  Take or apply over-the-counter and prescription medicines only as told by your health care provider.  Ask your health care provider about using over-the-counter mentholated ointment on your nails. Nail care  Trim your nails often.  Wash and dry your hands and feet every day.  Keep your feet dry: ? Wear absorbent socks, and change your socks frequently. ? Wear shoes that allow air to circulate, such as sandals or canvas tennis shoes. Throw out old shoes.  Do not use artificial nails.  If you go to a nail salon, make sure you choose one that uses clean instruments.  Use antifungal foot powder on your feet and in your shoes. General instructions  Do not share personal items, such as towels or nail clippers.  Do not walk barefoot in shower rooms or locker rooms.  Wear rubber gloves if you are working with your hands in wet areas.  Keep all follow-up visits as told by your health care provider. This is important.  Contact a health care provider if: Your infection is not getting better or it is getting worse after several months. Summary  A fungal nail infection is a common infection of the toenails or fingernails.  Treatment is not needed for mild infections. If you have significant nail changes, treatment may include taking medicine orally and applying medicine to your nails.  It can take a long time, usually up to a year, for the infection to go away. The infection may also come back.  Take or apply  over-the-counter and prescription medicines only as told by your health care provider.  Follow instructions for taking care of your nails to help prevent infection from coming back or spreading. This information is not intended to replace advice given to you by your health care provider. Make sure you discuss any questions you have with your health care provider. Document Revised: 06/09/2018 Document Reviewed: 07/23/2017 Elsevier Patient Education  [place].  Plantar Fasciitis Rehab Ask your health care provider which exercises are safe for you. Do exercises exactly as told by your health care provider and adjust them as directed. It is normal to feel mild stretching, pulling, tightness, or discomfort as you do these exercises. Stop right away if you feel sudden pain or your pain gets worse. Do not begin these exercises until told by your health care provider. Stretching and range-of-motion exercises These exercises warm up your muscles and joints and improve the movement and flexibility of your foot. These exercises also help to relieve pain. Plantar fascia stretch  1. Sit with your left / right leg crossed over your opposite knee. 2. Hold your heel with one hand with that thumb near your arch. With your other hand, hold your toes and gently pull them back toward the top of your foot. You should feel a stretch on the bottom of your toes or your foot (plantar fascia) or both. 3. Hold this stretch for__________ seconds. 4. Slowly release your toes and return to the starting position. Repeat __________ times. Complete this exercise __________ times a day. Gastrocnemius stretch, standing This exercise is also called a calf (gastroc) stretch. It stretches the muscles in the back of the upper calf. 1. Stand with your hands against a wall. 2. Extend your left / right leg behind you, and bend your front knee slightly. 3. Keeping your heels on the floor and your back knee straight, shift your  weight toward the wall. Do not arch your back. You should feel a gentle stretch in your upper left / right calf. 4. Hold this position for __________ seconds. Repeat __________ times. Complete this exercise __________ times a day. Soleus stretch, standing This exercise is also called a calf (soleus) stretch. It stretches the muscles in the back of the lower calf. 1. Stand with your hands against a wall. 2. Extend your left / right leg behind you, and bend your front knee slightly. 3. Keeping your heels on the floor, bend your back knee and shift your weight slightly over your back leg. You should feel a gentle stretch deep in your lower calf. 4. Hold this position for __________ seconds. Repeat __________ times. Complete this exercise __________ times a day. Gastroc and soleus stretch, standing step This exercise stretches the muscles in the back of the lower leg. These muscles are in the upper calf (gastrocnemius) and the lower calf (soleus). 1. Stand with the ball of your left / right foot on a step. The ball of your foot is on  the walking surface, right under your toes. 2. Keep your other foot firmly on the same step. 3. Hold on to the wall or a railing for balance. 4. Slowly lift your other foot, allowing your body weight to press your left / right heel down over the edge of the step. You should feel a stretch in your left / right calf. 5. Hold this position for __________ seconds. 6. Return both feet to the step. 7. Repeat this exercise with a slight bend in your left / right knee. Repeat __________ times with your left / right knee straight and __________ times with your left / right knee bent. Complete this exercise __________ times a day. Balance exercise This exercise builds your balance and strength control of your arch to help take pressure off your plantar fascia. Single leg stand If this exercise is too easy, you can try it with your eyes closed or while standing on a  pillow. 1. Without shoes, stand near a railing or in a doorway. You may hold on to the railing or door frame as needed. 2. Stand on your left / right foot. Keep your big toe down on the floor and try to keep your arch lifted. Do not let your foot roll inward. 3. Hold this position for __________ seconds. Repeat __________ times. Complete this exercise __________ times a day. This information is not intended to replace advice given to you by your health care provider. Make sure you discuss any questions you have with your health care provider. Document Revised: 06/09/2018 Document Reviewed: 12/15/2017 Elsevier Patient Education  [place].

## 2019-08-30 ENCOUNTER — Other Ambulatory Visit: Payer: Self-pay | Admitting: Podiatry

## 2019-08-31 ENCOUNTER — Telehealth: Payer: Self-pay | Admitting: Podiatry

## 2019-08-31 ENCOUNTER — Other Ambulatory Visit: Payer: Self-pay | Admitting: Podiatry

## 2019-08-31 DIAGNOSIS — B351 Tinea unguium: Secondary | ICD-10-CM

## 2019-08-31 LAB — HEPATIC FUNCTION PANEL
ALT: 32 IU/L (ref 0–32)
AST: 35 IU/L (ref 0–40)
Albumin: 4.8 g/dL (ref 3.8–4.9)
Alkaline Phosphatase: 102 IU/L (ref 48–121)
Bilirubin Total: 0.2 mg/dL (ref 0.0–1.2)
Bilirubin, Direct: 0.08 mg/dL (ref 0.00–0.40)
Total Protein: 8.2 g/dL (ref 6.0–8.5)

## 2019-08-31 MED ORDER — TERBINAFINE HCL 250 MG PO TABS: 250.0000 mg | ORAL_TABLET | Freq: Every day | ORAL | 0 refills | Status: AC

## 2019-08-31 NOTE — Telephone Encounter (Signed)
Pt called stating someone called it was not in chart

## 2019-10-12 ENCOUNTER — Encounter: Payer: TRICARE For Life (TFL) | Admitting: Student

## 2019-10-19 ENCOUNTER — Ambulatory Visit (INDEPENDENT_AMBULATORY_CARE_PROVIDER_SITE_OTHER): Payer: Medicare Other | Admitting: Student

## 2019-10-19 ENCOUNTER — Other Ambulatory Visit: Payer: Self-pay

## 2019-10-19 VITALS — BP 172/99 | HR 74 | Temp 98.2°F | Ht 66.0 in | Wt 315.6 lb

## 2019-10-19 DIAGNOSIS — M549 Dorsalgia, unspecified: Secondary | ICD-10-CM | POA: Diagnosis not present

## 2019-10-19 DIAGNOSIS — E1142 Type 2 diabetes mellitus with diabetic polyneuropathy: Secondary | ICD-10-CM

## 2019-10-19 DIAGNOSIS — M67432 Ganglion, left wrist: Secondary | ICD-10-CM

## 2019-10-19 DIAGNOSIS — M542 Cervicalgia: Secondary | ICD-10-CM

## 2019-10-19 DIAGNOSIS — G8929 Other chronic pain: Secondary | ICD-10-CM | POA: Diagnosis not present

## 2019-10-19 DIAGNOSIS — G43809 Other migraine, not intractable, without status migrainosus: Secondary | ICD-10-CM | POA: Diagnosis not present

## 2019-10-19 DIAGNOSIS — F5101 Primary insomnia: Secondary | ICD-10-CM

## 2019-10-19 DIAGNOSIS — G4700 Insomnia, unspecified: Secondary | ICD-10-CM | POA: Diagnosis not present

## 2019-10-19 DIAGNOSIS — I10 Essential (primary) hypertension: Secondary | ICD-10-CM

## 2019-10-19 DIAGNOSIS — E119 Type 2 diabetes mellitus without complications: Secondary | ICD-10-CM | POA: Diagnosis not present

## 2019-10-19 DIAGNOSIS — M6740 Ganglion, unspecified site: Secondary | ICD-10-CM

## 2019-10-19 LAB — POCT GLYCOSYLATED HEMOGLOBIN (HGB A1C): Hemoglobin A1C: 7.8 % — AB (ref 4.0–5.6)

## 2019-10-19 LAB — GLUCOSE, CAPILLARY: Glucose-Capillary: 133 mg/dL — ABNORMAL HIGH (ref 70–99)

## 2019-10-19 MED ORDER — LOSARTAN POTASSIUM 25 MG PO TABS: 25.0000 mg | ORAL_TABLET | Freq: Every day | ORAL | 11 refills | Status: DC

## 2019-10-19 MED ORDER — DULOXETINE HCL 30 MG PO CPEP: 30.0000 mg | ORAL_CAPSULE | Freq: Every day | ORAL | 6 refills | Status: DC

## 2019-10-19 MED ORDER — TRAZODONE HCL 100 MG PO TABS: 100.0000 mg | ORAL_TABLET | Freq: Every day | ORAL | 3 refills | Status: DC

## 2019-10-19 MED ORDER — CANAGLIFLOZIN 300 MG PO TABS: 300.0000 mg | ORAL_TABLET | Freq: Every day | ORAL | 9 refills | Status: DC

## 2019-10-19 NOTE — Assessment & Plan Note (Addendum)
Patient has history of chronic neck pain , joint pain, back pain, and arm pain.   Suspected etiology of neck pain cervical radiculopathy vs arthiritis, vs fibromyalgia.   Suspected source of chronic pain fibromyalgia vs arthiritis.   A/P - Cervical X-ray ordered - Started patient on Cymbalta 30 mg for fibromyalgia and see if pain improves. Instructed to take 30 mg the first week and take double the dose, 60 mg, daily thereafter.  - Recommended patient use ibuprofen for her arthritic pain.

## 2019-10-19 NOTE — Patient Instructions (Addendum)
Thank you for allowing [place] to assist in your medical care!   Today we discussed:  1) Insomnia - Please start taking the prescribed trazodone and melatonin.   2) Neck Pain - We have ordered a neck x-ray, they will call you to schedule this.   3) Chronic Pain - We will start you on cymbalta, please take as prescribed  4) High Blood Pressure - We have added losartan to your list.  5) Diabetes - Because your A1c is still elevated we are increasing your invokana to 300 mg.   6) Ganglion Cyst - We will be referring you to hand surgery.   If you have any questions or concerns please call our office.

## 2019-10-19 NOTE — Assessment & Plan Note (Signed)
Patient continues to struggle with sleep. Discussed sleep hygiene and that the bedroom is reserved only for sleep and intercourse and that she should not be watching TV in the bedroom. She notes in the past she has been on melatonin and that helped.  A/P - Discussed sleep hygiene in depth with the patient - Prescribed trazodone 100 mg tab, instructed patient to take half at first and if she is still struggling to sleep after the first week to take 1 tab at night.  - Instructed patient to purchase melatonin as well.

## 2019-10-19 NOTE — Assessment & Plan Note (Signed)
Patient has history of migraines. Suspected etiology of fibromyalgia, hypertension.   Discussed with patient need to control blood pressure at it may be causing her headaches.   A/P - Continue to manage blood pressure - If patient continues to have headaches after blood pressure is controlled, will assess other etiologies.

## 2019-10-19 NOTE — Assessment & Plan Note (Deleted)
Started patient on fibromyalgia as she states she was diagnosed with this in the past and tod it was the source of her pain.    A/P Patient to start on Cymbalta to see if pain improves.

## 2019-10-19 NOTE — Progress Notes (Signed)
CC: New PCP Appointment  HPI:  [name] is a 59 y.o. F with the PMHx listed below presenting to meet her new PCP. She states she has many health care goals, one is to control her blood pressure. Her blood pressure has been elevated during her last visits with [place] and her current regiment is amlodipine 10 mg and metoprolol succinate 100 mg. She notes her blood pressure is not normally high and she checks it at [facility] every so often. She states these pressures are 673'A systolically.  She also states that she would like to sleep better. She notes getting 2-3 hours of sleep at night. She states she struggles with falling asleep and staying a sleep. She states she has struggled with this in the past and was on melatonin on one time that helped. She states she has a TV in her bedroom that she uses until she is ready to go to sleep.    She also notes struggling with chronic pain that is in her neck, arms, back, and knees. She states the pain in her neck goes down her arms. She describes the pain as sharp and achey in nature. She notes the left arm pain is left sharp. She mentions in the past she has been diagnosed with fibromylagia and arthritis. In the past she has been prescribed tylenol 3's and tramadol for her pain. She also mentions taking ibuprofen in the past for her pain and it did not help. She states it has never been under control.   She also notes a bump on her left wrist that has been intermittently painful. She notes that she had something similar on her forehead in the past that was removed.   She notes she is taking all of her medications and tolerating them well.   She has no other complaints at this time.  Past Medical History:  Diagnosis Date  . Arthritis   . Diabetes ([place])   . Hypertension   . Neuropathy    Review of Systems:  Performed and all others negative  Physical Exam:  Vitals:   10/19/19 1457  BP: (!) 172/99  Pulse: 74  Temp: 98.2 F (36.8 C)    TempSrc: Oral  SpO2: 99%  Weight: (!) 315 lb 9.6 oz (143.2 kg)  Height: 5\' 6"  (1.676 m)   Physical Exam Vitals and nursing note reviewed.  Constitutional:      General: She is not in acute distress.    Appearance: Normal appearance. She is not ill-appearing or toxic-appearing.  HENT:     Head: Normocephalic and atraumatic.  Eyes:     Extraocular Movements: Extraocular movements intact.  Cardiovascular:     Rate and Rhythm: Normal rate and regular rhythm.     Pulses: Normal pulses.     Heart sounds: Normal heart sounds. No murmur heard.  No gallop.   Pulmonary:     Effort: Pulmonary effort is normal. No respiratory distress.     Breath sounds: Normal breath sounds. No stridor. No wheezing, rhonchi or rales.  Skin:    Comments: Ganglion cyst on left wrist.   Neurological:     General: No focal deficit present.     Mental Status: She is alert and oriented to person, place, and time.  Psychiatric:        Mood and Affect: Mood normal.        Behavior: Behavior normal.     Assessment & Plan:   See Encounters Tab for problem based charting.  Patient discussed with and seen by Dr. Philipp Ovens

## 2019-10-19 NOTE — Assessment & Plan Note (Signed)
A1c today of 7.8. Prior A1c's of 8.3 and 7.6. Patient recently started on invokana 100 mg QD. She tolerated this medication well.   A/P - Increase invokana to 300 mg QD - Continue metformin 1000 mg BID.

## 2019-10-19 NOTE — Assessment & Plan Note (Addendum)
Blood pressure of 172/99 during today's visit. Current regimen of amlodipine 10 mg and metoprolol succinate 100 mg. She was on an ACE-I in the past, but states it made her have bilateral leg pain.   She does not check her blood pressures at home, but states when she checks them at [facility] they are in the 638'G systolically.   Because of patient's history of diabetes, it was explained that she is at high risk for kidney damage, strokes, and heart attacks with an elevated blood pressure. At first she was hesitant to add a medication, but after talking with her about this risk factors she agrees to try an ARB.   Prior CBC, BMP, UA, protein to creatinine ratio were all unremarkable.   A/P - Continue amlodipine 10 mg, metoprolol succinate 100 mg, and start losartan 25 mg.  - Patient states she will buy a blood pressure machine and start taking at home.

## 2019-10-19 NOTE — Assessment & Plan Note (Addendum)
Ganglion cyst of left wrist.   A/P - Referred to hand surgery.

## 2019-10-20 NOTE — Progress Notes (Signed)
Internal Medicine Clinic Attending  I saw and evaluated the patient.  I personally confirmed the key portions of the history and exam documented by Dr. Katsadouros and I reviewed pertinent patient test results.  The assessment, diagnosis, and plan were formulated together and I agree with the documentation in the resident's note.  

## 2019-11-09 ENCOUNTER — Other Ambulatory Visit: Payer: Self-pay

## 2019-11-09 ENCOUNTER — Ambulatory Visit (HOSPITAL_COMMUNITY)
Admission: RE | Admit: 2019-11-09 | Discharge: 2019-11-09 | Disposition: A | Discharge status: Home / Self Care | Payer: Medicare Other | Source: Ambulatory Visit | Attending: Internal Medicine | Admitting: Internal Medicine

## 2019-11-09 DIAGNOSIS — G8929 Other chronic pain: Secondary | ICD-10-CM | POA: Insufficient documentation

## 2019-11-09 DIAGNOSIS — M542 Cervicalgia: Secondary | ICD-10-CM | POA: Diagnosis not present

## 2019-11-15 ENCOUNTER — Telehealth: Payer: Self-pay

## 2019-11-15 ENCOUNTER — Telehealth: Payer: Self-pay | Admitting: Internal Medicine

## 2019-11-15 NOTE — Telephone Encounter (Signed)
Called patient to discuss cervical XR. She states that she is still experiencing shoulder pain and will make a follow up appointment.

## 2019-11-15 NOTE — Telephone Encounter (Signed)
Requesting x-ray test results, please call pt back.  

## 2019-11-22 ENCOUNTER — Telehealth: Payer: Self-pay | Admitting: *Deleted

## 2019-11-22 NOTE — Telephone Encounter (Signed)
Patient calling to cancel appointment and would like someone to call back to reschedule it

## 2019-11-28 ENCOUNTER — Ambulatory Visit: Payer: Medicare Other | Admitting: Podiatry

## 2019-11-28 ENCOUNTER — Encounter: Payer: Self-pay | Admitting: Internal Medicine

## 2019-11-28 ENCOUNTER — Ambulatory Visit (INDEPENDENT_AMBULATORY_CARE_PROVIDER_SITE_OTHER): Payer: Medicare Other | Admitting: Internal Medicine

## 2019-11-28 ENCOUNTER — Other Ambulatory Visit: Payer: Self-pay

## 2019-11-28 VITALS — BP 132/75 | HR 81 | Temp 98.2°F | Ht 65.0 in | Wt 306.4 lb

## 2019-11-28 DIAGNOSIS — M25511 Pain in right shoulder: Secondary | ICD-10-CM

## 2019-11-28 DIAGNOSIS — M542 Cervicalgia: Secondary | ICD-10-CM

## 2019-11-28 DIAGNOSIS — G8929 Other chronic pain: Secondary | ICD-10-CM | POA: Diagnosis present

## 2019-11-28 NOTE — Progress Notes (Signed)
Internal Medicine Clinic Attending  Case discussed with Dr. Agyei  At the time of the visit.  We reviewed the resident's history and exam and pertinent patient test results.  I agree with the assessment, diagnosis, and plan of care documented in the resident's note.  

## 2019-11-28 NOTE — Progress Notes (Signed)
   CC: Follow-up chronic shoulder pain  HPI:  [name] is a 59 y.o. with medical history significant for osteoarthritis, hypertension here to follow-up on chronic neck and shoulder pain.  Please see problem based charting for further details.  Past Medical History:  Diagnosis Date  . Arthritis   . Diabetes ([place])   . Hypertension   . Neuropathy    Review of Systems:  As per HPI  Physical Exam:  Vitals:   11/28/19 0901  BP: (!) 145/87  Pulse: 91  Temp: 98.2 F (36.8 C)  TempSrc: Oral  Weight: (!) 306 lb 6.4 oz (139 kg)  Height: 5\' 5"  (1.651 m)   Physical Exam Constitutional:      Appearance: She is obese.  Cardiovascular:     Rate and Rhythm: Normal rate.     Heart sounds: No murmur heard.   Pulmonary:     Effort: Pulmonary effort is normal.     Breath sounds: No rales.  Musculoskeletal:     Right shoulder: Tenderness present. No swelling, bony tenderness or crepitus. Decreased range of motion (On external and internal rotation). Normal strength.     Left shoulder: Normal.     Comments: She has decreased range of motion and pain on active external rotation of the arms bilaterally while flexed.  In addition, she endorses pain with active internal rotation.  Posterior neck, right shoulder tender to palpation.  No obvious bruises or skin changes were noticed.   Neurological:     Mental Status: She is alert.     Assessment & Plan:   See Encounters Tab for problem based charting.  Patient discussed with Dr. Jimmye Norman

## 2019-11-28 NOTE — Patient Instructions (Signed)
Ms. Gilkey,   It was a pleasure taking care of you here in the clinic today.  As we discussed, the pain you have been having could be from muscle pain or arthritis pain.  I have referred you to physical therapy and hopefully this should provide some relief.  After physical therapy sessions, if you do not have any relief, please let us know.  Take Care! Dr. Eileen Stanford  Please call the internal medicine center clinic if you have any questions or concerns, we may be able to help and keep you from a long and expensive emergency room wait. Our clinic and after hours phone number is [phone], the best time to call is Monday through Friday 9 am to 4 pm but there is always someone available 24/7 if you have an emergency. If you need medication refills please notify your pharmacy one week in advance and they will send [place] a request.

## 2019-11-28 NOTE — Assessment & Plan Note (Signed)
Right shoulder/neck pain: Ruth Blackburn reports that she has been experiencing chronic neck and right shoulder pain for the past 6 months.  She states that the shoulder pain is constant, throbbing in nature and has been unrelieved with conservative management.  She was evaluated in the clinic about a month ago for which cervical x-ray was performed which was fairly unremarkable with exception of showing osteophytic changes in the cervical spine.  Currently, she rates her pain at 8/10, describes the pain as "somebody throwing bricks at my shoulder."  These episodes occur about twice a week.  She does report of radiating pain from her right shoulder to her right arm with associated paresthesia.  During her last clinic visit, she was started on low-dose Cymbalta with the theory that she might have fibromyalgia.  On physical exam today, she has decreased range of motion and pain on active external rotation of the arms bilaterally while flexed.  In addition, she endorses pain with active internal rotation.  Posterior neck, right shoulder tender to palpation.  No obvious bruises or skin changes were noticed.   Assessment and plan: Given the she has had chronic neck, shoulder pain with associated paresthesia there is concern for cervicalgia from arthritic changes of the cervical spine or disc herniation.  In addition, she did have signs of shoulder impingement on physical exams making rotator cuff tendinopathy also very likely.  In addition, this could be musculoskeletal in nature.  Plan: -Trial of Physical therapy -If no improvement, will prescribe a short course of muscle relaxant

## 2019-12-05 ENCOUNTER — Other Ambulatory Visit: Payer: Self-pay

## 2019-12-05 ENCOUNTER — Ambulatory Visit (INDEPENDENT_AMBULATORY_CARE_PROVIDER_SITE_OTHER): Payer: Medicare Other | Admitting: Podiatry

## 2019-12-05 DIAGNOSIS — Z79899 Other long term (current) drug therapy: Secondary | ICD-10-CM

## 2019-12-05 DIAGNOSIS — I10 Essential (primary) hypertension: Secondary | ICD-10-CM

## 2019-12-05 DIAGNOSIS — M722 Plantar fascial fibromatosis: Secondary | ICD-10-CM | POA: Diagnosis not present

## 2019-12-05 DIAGNOSIS — B351 Tinea unguium: Secondary | ICD-10-CM | POA: Diagnosis not present

## 2019-12-05 MED ORDER — LOSARTAN POTASSIUM 25 MG PO TABS: 25.0000 mg | ORAL_TABLET | Freq: Every day | ORAL | 3 refills | Status: DC

## 2019-12-05 NOTE — Patient Instructions (Signed)
Terbinafine oral granules What is this medicine? TERBINAFINE (TER bin a feen) is an antifungal medicine. It is used to treat certain kinds of fungal or yeast infections. This medicine may be used for other purposes; ask your health care provider or pharmacist if you have questions. COMMON BRAND NAME(S): Lamisil What should I tell my health care provider before I take this medicine? They need to know if you have any of these conditions:  drink alcoholic beverages  kidney disease  liver disease  an unusual or allergic reaction to Terbinafine, other medicines, foods, dyes, or preservatives  pregnant or trying to get pregnant  breast-feeding How should I use this medicine? Take this medicine by mouth. Follow the directions on the prescription label. Hold packet with cut line on top. Shake packet gently to settle contents. Tear packet open along cut line, or use scissors to cut across line. Carefully pour the entire contents of packet onto a spoonful of a soft food, such as pudding or other soft, non-acidic food such as mashed potatoes (do NOT use applesauce or a fruit-based food). If two packets are required for each dose, you may either sprinkle the content of both packets on one spoonful of non-acidic food, or sprinkle the contents of both packets on two spoonfuls of non-acidic food. Make sure that no granules remain in the packet. Swallow the mxiture of the food and granules without chewing. Take your medicine at regular intervals. Do not take it more often than directed. Take all of your medicine as directed even if you think you are better. Do not skip doses or stop your medicine early. Contact your pediatrician or health care professional regarding the use of this medicine in children. While this medicine may be prescribed for children as young as 4 years for selected conditions, precautions do apply. Overdosage: If you think you have taken too much of this medicine contact a poison control  center or emergency room at once. NOTE: This medicine is only for you. Do not share this medicine with others. What if I miss a dose? If you miss a dose, take it as soon as you can. If it is almost time for your next dose, take only that dose. Do not take double or extra doses. What may interact with this medicine? Do not take this medicine with any of the following medications:  thioridazine This medicine may also interact with the following medications:  beta-blockers  caffeine  cimetidine  cyclosporine  MAOIs like Carbex, Eldepryl, Marplan, Nardil, and Parnate  medicines for fungal infections like fluconazole and ketoconazole  medicines for irregular heartbeat like amiodarone, flecainide and propafenone  rifampin  SSRIs like citalopram, escitalopram, fluoxetine, fluvoxamine, paroxetine and sertraline  tricyclic antidepressants like amitriptyline, clomipramine, desipramine, imipramine, nortriptyline, and others  warfarin This list may not describe all possible interactions. Give your health care provider a list of all the medicines, herbs, non-prescription drugs, or dietary supplements you use. Also tell them if you smoke, drink alcohol, or use illegal drugs. Some items may interact with your medicine. What should I watch for while using this medicine? Your doctor may monitor your liver function. Tell your doctor right away if you have nausea or vomiting, loss of appetite, stomach pain on your right upper side, yellow skin, dark urine, light stools, or are over tired. This medicine may cause serious skin reactions. They can happen weeks to months after starting the medicine. Contact your health care provider right away if you notice fevers or flu-like symptoms   with a rash. The rash may be red or purple and then turn into blisters or peeling of the skin. Or, you might notice a red rash with swelling of the face, lips or lymph nodes in your neck or under your arms. You need to take  this medicine for 6 weeks or longer to cure the fungal infection. Take your medicine regularly for as long as your doctor or health care provider tells you to. What side effects may I notice from receiving this medicine? Side effects that you should report to your doctor or health care professional as soon as possible:  allergic reactions like skin rash or hives, swelling of the face, lips, or tongue  change in vision  dark urine  fever or infection  general ill feeling or flu-like symptoms  light-colored stools  loss of appetite, nausea  rash, fever, and swollen lymph nodes  redness, blistering, peeling or loosening of the skin, including inside the mouth  right upper belly pain  unusually weak or tired  yellowing of the eyes or skin Side effects that usually do not require medical attention (report to your doctor or health care professional if they continue or are bothersome):  changes in taste  diarrhea  hair loss  muscle or joint pain  stomach upset This list may not describe all possible side effects. Call your doctor for medical advice about side effects. You may report side effects to FDA at 1-800-FDA-1088. Where should I keep my medicine? Keep out of the reach of children. Store at room temperature between 15 and 30 degrees C (59 and 86 degrees F). Throw away any unused medicine after the expiration date. NOTE: This sheet is a summary. It may not cover all possible information. If you have questions about this medicine, talk to your doctor, pharmacist, or health care provider.  2020 Elsevier/Gold Standard (2018-05-27 15:35:11)  

## 2019-12-06 ENCOUNTER — Encounter: Payer: Self-pay | Admitting: *Deleted

## 2019-12-06 NOTE — Progress Notes (Signed)
Subjective: 59 year old female presents the office today for evaluation of onychomycosis, currently on Lamisil.  She states that she is tolerating medication without any side effects.  She states that her heel pain is doing better some days and other days she will have discomfort.  She still stretching and icing some.  She has no recent injury or changes since I last saw her no other concerns. Denies any systemic complaints such as fevers, chills, nausea, vomiting. No acute changes since last appointment, and no other complaints at this time.   Objective: AAO x3, NAD DP/PT pulses palpable bilaterally, CRT less than 3 seconds At this time there is no significant discomfort on the course or insertion of plantar fascial.  No pain with Achilles tendon.  No pain about compression of calcaneus there is no area of pinpoint tenderness.  Left hallux toenail is hypertrophic, dystrophic with yellow-brown discoloration mostly along the border.  There is no pain to the nail there is no redness or drainage or signs of infection.  No pain with calf compression, swelling, warmth, erythema  Assessment: 59 year old female with onychomycosis, plantar fasciitis  Plan: -All treatment options discussed with the patient including all alternatives, risks, complications.  -She is tolerating Lamisil well however I will recheck a CBC LFT.  Continue monitoring side effects.  Discussed success rates of Lamisil.  Did debride the nails any complications or bleeding. -Regards the heel pain discussed stretching, icing daily as well as wearing supportive shoes.  No pain today so we held off on a steroid injection. -Patient encouraged to call the office with any questions, concerns, change in symptoms.   Trula Slade DPM

## 2019-12-08 LAB — CBC WITH DIFFERENTIAL/PLATELET
Absolute Monocytes: 561 cells/uL (ref 200–950)
Basophils Absolute: 47 cells/uL (ref 0–200)
Basophils Relative: 0.6 %
Eosinophils Absolute: 229 cells/uL (ref 15–500)
Eosinophils Relative: 2.9 %
HCT: 45.3 % — ABNORMAL HIGH (ref 35.0–45.0)
Hemoglobin: 14.4 g/dL (ref 11.7–15.5)
Lymphs Abs: 3452 cells/uL (ref 850–3900)
MCH: 26 pg — ABNORMAL LOW (ref 27.0–33.0)
MCHC: 31.8 g/dL — ABNORMAL LOW (ref 32.0–36.0)
MCV: 81.9 fL (ref 80.0–100.0)
MPV: 10.1 fL (ref 7.5–12.5)
Monocytes Relative: 7.1 %
Neutro Abs: 3610 cells/uL (ref 1500–7800)
Neutrophils Relative %: 45.7 %
Platelets: 282 10*3/uL (ref 140–400)
RBC: 5.53 10*6/uL — ABNORMAL HIGH (ref 3.80–5.10)
RDW: 16.3 % — ABNORMAL HIGH (ref 11.0–15.0)
Total Lymphocyte: 43.7 %
WBC: 7.9 10*3/uL (ref 3.8–10.8)

## 2019-12-08 LAB — HEPATIC FUNCTION PANEL
AG Ratio: 1.4 (calc) (ref 1.0–2.5)
ALT: 26 U/L (ref 6–29)
AST: 22 U/L (ref 10–35)
Albumin: 4.2 g/dL (ref 3.6–5.1)
Alkaline phosphatase (APISO): 67 U/L (ref 37–153)
Bilirubin, Direct: 0.1 mg/dL (ref 0.0–0.2)
Globulin: 3 g/dL (calc) (ref 1.9–3.7)
Indirect Bilirubin: 0.2 mg/dL (calc) (ref 0.2–1.2)
Total Bilirubin: 0.3 mg/dL (ref 0.2–1.2)
Total Protein: 7.2 g/dL (ref 6.1–8.1)

## 2019-12-15 ENCOUNTER — Other Ambulatory Visit: Payer: Self-pay | Admitting: Student

## 2019-12-15 DIAGNOSIS — E1142 Type 2 diabetes mellitus with diabetic polyneuropathy: Secondary | ICD-10-CM

## 2019-12-15 MED ORDER — METFORMIN HCL 1000 MG PO TABS: 1000.0000 mg | ORAL_TABLET | Freq: Two times a day (BID) | ORAL | 3 refills | Status: DC

## 2019-12-15 NOTE — Telephone Encounter (Signed)
Need refill on metFORMIN (GLUCOPHAGE) 1000 MG tablet  ;pt contact [facility] ([place]),  - [address]

## 2019-12-22 ENCOUNTER — Ambulatory Visit: Payer: Medicare Other | Admitting: Physical Therapy

## 2020-01-04 ENCOUNTER — Other Ambulatory Visit: Payer: Self-pay | Admitting: Student

## 2020-01-04 DIAGNOSIS — Z1231 Encounter for screening mammogram for malignant neoplasm of breast: Secondary | ICD-10-CM

## 2020-01-22 NOTE — Addendum Note (Signed)
Addended by: Hulan Fray on: 01/22/2020 06:51 PM   Modules accepted: Orders

## 2020-01-30 ENCOUNTER — Telehealth: Payer: Self-pay

## 2020-01-30 NOTE — Telephone Encounter (Signed)
Pt had multiple complaints, states she has been calling and talking to the nurse that answers the phone and she still doesn't have her medicine. No encounter noted r/t medicine. Script was done 10/15, called [facility], it will be ready for pt pick up by lunch time. Informed pt She then c/o back pain states no one is doing anything about it, wants some medicine, informed pt she would need to have a visit. appt w/ pcp 12/28 at 1345

## 2020-01-30 NOTE — Telephone Encounter (Signed)
I agree that she will need to be seen due to her multiple concerns.

## 2020-02-15 ENCOUNTER — Ambulatory Visit: Payer: TRICARE For Life (TFL)

## 2020-02-26 ENCOUNTER — Ambulatory Visit
Admission: RE | Admit: 2020-02-26 | Discharge: 2020-02-26 | Disposition: A | Discharge status: Home / Self Care | Payer: Medicare Other | Source: Ambulatory Visit | Attending: Family Medicine | Admitting: Family Medicine

## 2020-02-26 ENCOUNTER — Other Ambulatory Visit: Payer: Self-pay

## 2020-02-26 DIAGNOSIS — Z1231 Encounter for screening mammogram for malignant neoplasm of breast: Secondary | ICD-10-CM

## 2020-02-27 ENCOUNTER — Encounter: Payer: Self-pay | Admitting: Internal Medicine

## 2020-02-27 ENCOUNTER — Other Ambulatory Visit: Payer: Self-pay

## 2020-02-27 ENCOUNTER — Ambulatory Visit (INDEPENDENT_AMBULATORY_CARE_PROVIDER_SITE_OTHER): Payer: Medicare Other | Admitting: Internal Medicine

## 2020-02-27 VITALS — BP 130/73 | HR 60 | Temp 98.4°F | Ht 65.0 in | Wt 306.4 lb

## 2020-02-27 DIAGNOSIS — M75101 Unspecified rotator cuff tear or rupture of right shoulder, not specified as traumatic: Secondary | ICD-10-CM | POA: Diagnosis not present

## 2020-02-27 DIAGNOSIS — E1142 Type 2 diabetes mellitus with diabetic polyneuropathy: Secondary | ICD-10-CM | POA: Diagnosis present

## 2020-02-27 DIAGNOSIS — F5101 Primary insomnia: Secondary | ICD-10-CM | POA: Diagnosis not present

## 2020-02-27 DIAGNOSIS — G8929 Other chronic pain: Secondary | ICD-10-CM | POA: Diagnosis not present

## 2020-02-27 DIAGNOSIS — Z0000 Encounter for general adult medical examination without abnormal findings: Secondary | ICD-10-CM

## 2020-02-27 DIAGNOSIS — Z124 Encounter for screening for malignant neoplasm of cervix: Secondary | ICD-10-CM

## 2020-02-27 DIAGNOSIS — S46001A Unspecified injury of muscle(s) and tendon(s) of the rotator cuff of right shoulder, initial encounter: Secondary | ICD-10-CM

## 2020-02-27 LAB — POCT GLYCOSYLATED HEMOGLOBIN (HGB A1C): Hemoglobin A1C: 7.4 % — AB (ref 4.0–5.6)

## 2020-02-27 LAB — GLUCOSE, CAPILLARY: Glucose-Capillary: 101 mg/dL — ABNORMAL HIGH (ref 70–99)

## 2020-02-27 MED ORDER — NAPROXEN 500 MG PO TABS: 500.0000 mg | ORAL_TABLET | Freq: Two times a day (BID) | ORAL | 1 refills | Status: DC | PRN

## 2020-02-27 MED ORDER — ATORVASTATIN CALCIUM 20 MG PO TABS
20.0000 mg | ORAL_TABLET | Freq: Every day | ORAL | 11 refills | Status: DC
Start: 2020-02-27 — End: 2021-03-26

## 2020-02-27 MED ORDER — TRAZODONE HCL 100 MG PO TABS: 100.0000 mg | ORAL_TABLET | Freq: Every day | ORAL | 3 refills | Status: DC

## 2020-02-27 NOTE — Assessment & Plan Note (Signed)
See supraspinatus tear

## 2020-02-27 NOTE — Assessment & Plan Note (Signed)
  She states trazadone and melatonin have helped improve her sleep a lot although pain has caused her some difficulty with sleeping recently.   - trazadone refilled  - see rotator cuff tear for pain

## 2020-02-27 NOTE — Assessment & Plan Note (Signed)
Ongoing shoulder pain that is constant and worse with all movement. Pain is around an 8, sometimes a 10. She uses heating pad. Ice does not help. She is not taking anything for the pain, and it is making it difficult to sleep. She also has neck and proximal shoulder pain bilaterally with muscles feeling tight, bilateral knee pain and chronic left foot pain. She denies radiation of pain down the arms or legs. She has no numbness or tingling.   She knows that a lot of her pain is due to weight, and she has been trying to lose weight and has been walking. She's lost about 9lbs since August.  [facility] notes show recent MRI with high grade supraspinatus tear, mild-moderate tendinosis and mild-moderate ac joint arthrosis. They are planning surgical repair but need surgical clearance.  Recent cervical xr with osteophytic changes C4-C7. Tenderness over trapezius bilaterally. I Her muscular pain and tightness of the trapezius is likely secondary to her shoulder pain.   - Patient's ACS NSQIP score is below average, RCRI class 0 - arozullah score is class 1 with <.5% chance of respiratory failure.  - Stop-bang is intermediate risk, recommend extubation to bipap  - start naproxen 500 mg bid prn for shoulder and neck pain, can consider short term opioid medication for shoulder pain if no improvement or flexeril with muscular neck pain.  - can consider starting cymbalta at follow-up for diffuse pain. She is not taking this currently and does not think she has taken it before, and her pain seems mostly consistent with rotator cuff and arthritis at this time

## 2020-02-27 NOTE — Patient Instructions (Addendum)
Thank you for allowing [place] to provide your care today. Today we discussed your shoulder pain and diabetes.     Please START taking  Naproxen 500 mg - take 1 tablet every 12 hours as needed for shoulder pain   Atorvastatin 20 mg - take 1 tablet per day for diabetes  Please follow-up in three months for hemoglobin a1c check    Please call the internal medicine center clinic if you have any questions or concerns, we may be able to help and keep you from a long and expensive emergency room wait. Our clinic and after hours phone number is [phone], the best time to call is Monday through Friday 9 am to 4 pm but there is always someone available 24/7 if you have an emergency. If you need medication refills please notify your pharmacy one week in advance and they will send [place] a request.

## 2020-02-27 NOTE — Assessment & Plan Note (Signed)
°-   history of hysterectomy for fibroids, unclear if cervix removed. Pap smear at follow-up.  - optho exam done this year normal - requested she have records faxed

## 2020-02-27 NOTE — Progress Notes (Signed)
   CC: type II diabetes  HPI:  [name] is a 59 y.o. with PMH as below.   Please see A&P for assessment of the patient's acute and chronic medical conditions.   Past Medical History:  Diagnosis Date  . Arthritis   . Diabetes ([facility])   . Hypertension   . Neuropathy    Review of Systems:   Review of Systems  Constitutional: Positive for weight loss. Negative for chills, fever and malaise/fatigue.  Eyes: Negative for blurred vision and double vision.  Respiratory: Negative for cough and shortness of breath.   Cardiovascular: Negative for chest pain, palpitations and leg swelling.  Gastrointestinal: Negative for abdominal pain, blood in stool, constipation, diarrhea, melena, nausea and vomiting.  Musculoskeletal: Positive for back pain, joint pain and neck pain. Negative for falls.  Neurological: Positive for focal weakness. Negative for dizziness, tingling, tremors, sensory change and headaches.   Physical Exam:  Constitution: NAD, appears stated age, morbidly obese HENT: [place]/AT, no tenderness cervical muscles Cardio: RRR, no m/r/g, no LE edema  Respiratory: CTA, no w/r/r Abdominal: NTTP, soft, non-distended MSK:  RUE: pain with movement of shoulder, especially abduction, difficult lifting above 90 degrees. Tenderness of trapezius bilaterally, no swelling or lesions  Neuro: normal affect, a&ox3 Skin: c/d/i    Vitals:   02/27/20 1320  BP: 130/73  Pulse: 60  Temp: 98.4 F (36.9 C)  TempSrc: Oral  SpO2: 98%  Weight: (!) 306 lb 6.4 oz (139 kg)  Height: 5\' 5"  (1.651 m)     Assessment & Plan:   See Encounters Tab for problem based charting.  Patient discussed with Dr. 

## 2020-02-27 NOTE — Assessment & Plan Note (Signed)
Last a1c 7.8. Today 7.4. She is taking invokana 300 mg qd and metformin 1000 mg bid.   - continue current medications  - had eye exam earlier this year which was normal.  - will need foot exam at follow-up  - denies neuropathy, does not use gabapentin, discontinued medication  - f/u in 3 months for a1c check  - start atorvastatin 20 mg qd

## 2020-02-28 NOTE — Progress Notes (Signed)
Internal Medicine Clinic Attending  Case discussed with Dr. Seawell  At the time of the visit.  We reviewed the resident's history and exam and pertinent patient test results.  I agree with the assessment, diagnosis, and plan of care documented in the resident's note.  

## 2020-04-02 DIAGNOSIS — U071 COVID-19: Secondary | ICD-10-CM

## 2020-04-02 HISTORY — DX: COVID-19: U07.1

## 2020-04-05 ENCOUNTER — Telehealth: Payer: Self-pay

## 2020-04-05 NOTE — Telephone Encounter (Signed)
Pt wanted to let physician know she tested positive for COVID [phone]

## 2020-04-06 ENCOUNTER — Observation Stay (HOSPITAL_COMMUNITY)
Admission: EM | Admit: 2020-04-06 | Discharge: 2020-04-07 | Disposition: A | Discharge status: Home / Self Care | Payer: Medicare Other | Attending: Student in an Organized Health Care Education/Training Program | Admitting: Student in an Organized Health Care Education/Training Program

## 2020-04-06 ENCOUNTER — Encounter (HOSPITAL_COMMUNITY): Payer: Self-pay | Admitting: Emergency Medicine

## 2020-04-06 ENCOUNTER — Other Ambulatory Visit: Payer: Self-pay

## 2020-04-06 ENCOUNTER — Emergency Department (HOSPITAL_COMMUNITY): Payer: Medicare Other

## 2020-04-06 DIAGNOSIS — E876 Hypokalemia: Secondary | ICD-10-CM

## 2020-04-06 DIAGNOSIS — Z7984 Long term (current) use of oral hypoglycemic drugs: Secondary | ICD-10-CM | POA: Diagnosis not present

## 2020-04-06 DIAGNOSIS — J1282 Pneumonia due to coronavirus disease 2019: Secondary | ICD-10-CM | POA: Insufficient documentation

## 2020-04-06 DIAGNOSIS — U071 COVID-19: Principal | ICD-10-CM | POA: Insufficient documentation

## 2020-04-06 DIAGNOSIS — E119 Type 2 diabetes mellitus without complications: Secondary | ICD-10-CM | POA: Diagnosis not present

## 2020-04-06 DIAGNOSIS — I10 Essential (primary) hypertension: Secondary | ICD-10-CM | POA: Insufficient documentation

## 2020-04-06 DIAGNOSIS — J9601 Acute respiratory failure with hypoxia: Secondary | ICD-10-CM | POA: Diagnosis not present

## 2020-04-06 DIAGNOSIS — Z87891 Personal history of nicotine dependence: Secondary | ICD-10-CM | POA: Insufficient documentation

## 2020-04-06 DIAGNOSIS — R9431 Abnormal electrocardiogram [ECG] [EKG]: Secondary | ICD-10-CM

## 2020-04-06 DIAGNOSIS — Z79899 Other long term (current) drug therapy: Secondary | ICD-10-CM | POA: Insufficient documentation

## 2020-04-06 LAB — CBC
HCT: 43.2 % (ref 36.0–46.0)
Hemoglobin: 14.6 g/dL (ref 12.0–15.0)
MCH: 26.1 pg (ref 26.0–34.0)
MCHC: 33.8 g/dL (ref 30.0–36.0)
MCV: 77.1 fL — ABNORMAL LOW (ref 80.0–100.0)
Platelets: 319 10*3/uL (ref 150–400)
RBC: 5.6 MIL/uL — ABNORMAL HIGH (ref 3.87–5.11)
RDW: 15.4 % (ref 11.5–15.5)
WBC: 9.2 10*3/uL (ref 4.0–10.5)
nRBC: 0 % (ref 0.0–0.2)

## 2020-04-06 LAB — BASIC METABOLIC PANEL
Anion gap: 16 — ABNORMAL HIGH (ref 5–15)
BUN: 7 mg/dL (ref 6–20)
CO2: 27 mmol/L (ref 22–32)
Calcium: 8.8 mg/dL — ABNORMAL LOW (ref 8.9–10.3)
Chloride: 95 mmol/L — ABNORMAL LOW (ref 98–111)
Creatinine, Ser: 0.71 mg/dL (ref 0.44–1.00)
GFR, Estimated: >60 mL/min (ref >60)
Glucose, Bld: 185 mg/dL — ABNORMAL HIGH (ref 70–99)
Potassium: 2.9 mmol/L — ABNORMAL LOW (ref 3.5–5.1)
Sodium: 138 mmol/L (ref 135–145)

## 2020-04-06 LAB — D-DIMER, QUANTITATIVE: D-Dimer, Quant: 0.51 ug/mL-FEU — ABNORMAL HIGH (ref 0.00–0.50)

## 2020-04-06 LAB — LACTIC ACID, PLASMA: Lactic Acid, Venous: 1.8 mmol/L (ref 0.5–1.9)

## 2020-04-06 LAB — GLUCOSE, CAPILLARY: Glucose-Capillary: 321 mg/dL — ABNORMAL HIGH (ref 70–99)

## 2020-04-06 LAB — HEPATIC FUNCTION PANEL
ALT: 28 U/L (ref 0–44)
AST: 31 U/L (ref 15–41)
Albumin: 3.2 g/dL — ABNORMAL LOW (ref 3.5–5.0)
Alkaline Phosphatase: 62 U/L (ref 38–126)
Bilirubin, Direct: 0.1 mg/dL (ref 0.0–0.2)
Indirect Bilirubin: 0.5 mg/dL (ref 0.3–0.9)
Total Bilirubin: 0.6 mg/dL (ref 0.3–1.2)
Total Protein: 8 g/dL (ref 6.5–8.1)

## 2020-04-06 LAB — TRIGLYCERIDES: Triglycerides: 138 mg/dL (ref <150)

## 2020-04-06 LAB — FIBRINOGEN: Fibrinogen: 722 mg/dL — ABNORMAL HIGH (ref 210–475)

## 2020-04-06 LAB — FERRITIN: Ferritin: 103 ng/mL (ref 11–307)

## 2020-04-06 LAB — LACTATE DEHYDROGENASE: LDH: 293 U/L — ABNORMAL HIGH (ref 98–192)

## 2020-04-06 LAB — PROCALCITONIN: Procalcitonin: <0.1 ng/mL

## 2020-04-06 LAB — SARS CORONAVIRUS 2 BY RT PCR (HOSPITAL ORDER, PERFORMED IN ~~LOC~~ HOSPITAL LAB): SARS Coronavirus 2: POSITIVE — AB

## 2020-04-06 LAB — CBG MONITORING, ED: Glucose-Capillary: 180 mg/dL — ABNORMAL HIGH (ref 70–99)

## 2020-04-06 LAB — TROPONIN I (HIGH SENSITIVITY)
Troponin I (High Sensitivity): 5 ng/L (ref <18)
Troponin I (High Sensitivity): 5 ng/L (ref <18)

## 2020-04-06 LAB — I-STAT BETA HCG BLOOD, ED (MC, WL, AP ONLY): I-stat hCG, quantitative: <5 m[IU]/mL (ref <5)

## 2020-04-06 LAB — C-REACTIVE PROTEIN: CRP: 8 mg/dL — ABNORMAL HIGH (ref <1.0)

## 2020-04-06 MED ORDER — LINAGLIPTIN 5 MG PO TABS
5.0000 mg | ORAL_TABLET | Freq: Every day | ORAL | Status: DC
  Administered 2020-04-06 – 2020-04-07 (×2): 5 mg via ORAL
  Filled 2020-04-06 (×2): qty 1

## 2020-04-06 MED ORDER — METOPROLOL SUCCINATE ER 100 MG PO TB24
100.0000 mg | ORAL_TABLET | Freq: Every day | ORAL | Status: DC
  Administered 2020-04-06 – 2020-04-07 (×2): 100 mg via ORAL
  Filled 2020-04-06: qty 1

## 2020-04-06 MED ORDER — POTASSIUM CHLORIDE CRYS ER 20 MEQ PO TBCR
40.0000 meq | EXTENDED_RELEASE_TABLET | Freq: Once | ORAL | Status: AC
  Administered 2020-04-06: 40 meq via ORAL
  Filled 2020-04-06: qty 2

## 2020-04-06 MED ORDER — DEXAMETHASONE SODIUM PHOSPHATE 10 MG/ML IJ SOLN
6.0000 mg | INTRAMUSCULAR | Status: DC
  Administered 2020-04-06 – 2020-04-07 (×2): 6 mg via INTRAVENOUS
  Filled 2020-04-06 (×2): qty 1

## 2020-04-06 MED ORDER — ACETAMINOPHEN 325 MG PO TABS: 650.0000 mg | ORAL_TABLET | Freq: Four times a day (QID) | ORAL | Status: DC | PRN

## 2020-04-06 MED ORDER — GUAIFENESIN-DM 100-10 MG/5ML PO SYRP
10.0000 mL | ORAL_SOLUTION | ORAL | Status: DC | PRN
  Administered 2020-04-06: 10 mL via ORAL
  Filled 2020-04-06: qty 10

## 2020-04-06 MED ORDER — SODIUM CHLORIDE 0.9 % IV BOLUS
500.0000 mL | Freq: Once | INTRAVENOUS | Status: AC
  Administered 2020-04-06: 500 mL via INTRAVENOUS

## 2020-04-06 MED ORDER — SODIUM CHLORIDE 0.9 % IV SOLN
100.0000 mg | Freq: Every day | INTRAVENOUS | Status: DC
  Administered 2020-04-07: 100 mg via INTRAVENOUS
  Filled 2020-04-06: qty 20

## 2020-04-06 MED ORDER — SENNOSIDES-DOCUSATE SODIUM 8.6-50 MG PO TABS: 1.0000 | ORAL_TABLET | Freq: Every evening | ORAL | Status: DC | PRN

## 2020-04-06 MED ORDER — SODIUM CHLORIDE 0.9 % IV SOLN
200.0000 mg | Freq: Once | INTRAVENOUS | Status: AC
  Administered 2020-04-06: 200 mg via INTRAVENOUS
  Filled 2020-04-06: qty 200

## 2020-04-06 MED ORDER — INSULIN ASPART 100 UNIT/ML ~~LOC~~ SOLN
0.0000 [IU] | Freq: Three times a day (TID) | SUBCUTANEOUS | Status: DC
  Administered 2020-04-06: 3 [IU] via SUBCUTANEOUS

## 2020-04-06 MED ORDER — PANTOPRAZOLE SODIUM 40 MG PO TBEC
40.0000 mg | DELAYED_RELEASE_TABLET | Freq: Every day | ORAL | Status: DC
  Administered 2020-04-06 – 2020-04-07 (×2): 40 mg via ORAL
  Filled 2020-04-06 (×2): qty 1

## 2020-04-06 MED ORDER — ONDANSETRON HCL 4 MG/2ML IJ SOLN
4.0000 mg | Freq: Once | INTRAMUSCULAR | Status: AC
  Administered 2020-04-06: 4 mg via INTRAVENOUS
  Filled 2020-04-06: qty 2

## 2020-04-06 MED ORDER — TRAZODONE HCL 50 MG PO TABS
100.0000 mg | ORAL_TABLET | Freq: Every day | ORAL | Status: DC
  Filled 2020-04-06: qty 2

## 2020-04-06 MED ORDER — ONDANSETRON HCL 4 MG PO TABS: 4.0000 mg | ORAL_TABLET | Freq: Four times a day (QID) | ORAL | Status: DC | PRN

## 2020-04-06 MED ORDER — HYDROCOD POLST-CPM POLST ER 10-8 MG/5ML PO SUER: 5.0000 mL | Freq: Two times a day (BID) | ORAL | Status: DC | PRN

## 2020-04-06 MED ORDER — ATORVASTATIN CALCIUM 10 MG PO TABS: 20.0000 mg | ORAL_TABLET | Freq: Every day | ORAL | Status: DC

## 2020-04-06 MED ORDER — ENOXAPARIN SODIUM 40 MG/0.4ML ~~LOC~~ SOLN
40.0000 mg | SUBCUTANEOUS | Status: DC
  Administered 2020-04-06 – 2020-04-07 (×2): 40 mg via SUBCUTANEOUS
  Filled 2020-04-06 (×2): qty 0.4

## 2020-04-06 MED ORDER — INSULIN ASPART 100 UNIT/ML ~~LOC~~ SOLN
0.0000 [IU] | Freq: Three times a day (TID) | SUBCUTANEOUS | Status: DC
  Administered 2020-04-07 (×2): 4 [IU] via SUBCUTANEOUS

## 2020-04-06 MED ORDER — AMLODIPINE BESYLATE 10 MG PO TABS
10.0000 mg | ORAL_TABLET | Freq: Every day | ORAL | Status: DC
  Administered 2020-04-06 – 2020-04-07 (×2): 10 mg via ORAL
  Filled 2020-04-06: qty 1

## 2020-04-06 MED ORDER — ONDANSETRON HCL 4 MG/2ML IJ SOLN: 4.0000 mg | Freq: Four times a day (QID) | INTRAMUSCULAR | Status: DC | PRN

## 2020-04-06 NOTE — ED Notes (Signed)
ED Provider at bedside. Britni, PA

## 2020-04-06 NOTE — H&P (Addendum)
Date: 04/06/2020               Patient Name:  Ruth Blackburn MRN: [id number]  DOB: 1960/08/15 Age / Sex: 60 y.o., female   PCP: Riesa Pope, MD         Medical Service: Internal Medicine Teaching Service         Attending Physician: Dr. Evette Doffing, Mallie Mussel, *    First Contact: Dr. Gaylan Gerold Pager: [phone]  Second Contact: Dr. Harvie Heck Pager: [phone]       After Hours (After 5p/  First Contact Pager: [phone]  weekends / holidays): Second Contact Pager: [phone]   Chief Complaint: shortness of breath  History of Present Illness:  [name] is a 60 year old female with PMHx of diabetes, hypertension, and hyperlipidemia presenting with shortness of breath.  Patient endorses congestion and rhinorrhea with productive cough for approximately 2 weeks.  She initially thought this was her allergies and symptomatically treated at home.  However, notes worsening shortness of breath secondary to worsening cough.  She is also noted ongoing nausea with decreased appetite and dysgeusia with intermittent episodes of nonbloody nonbilious emesis.  She was tested for Covid on Wednesday via [facility] and noted that she was positive.  Patient does endorse some chest pain that is worsened with coughing.  Denies any ongoing chest pain at this time.  He denies any fevers or chills, abdominal pain, diarrhea, dysuria.  ED course Patient presented with worsening shortness of breath noted to be hypoxic to 88% on room air requiring 3 L oxygen via nasal cannula.  CBC without leukocytosis.  Mild hypokalemia and elevated anion gap on BMP.  EKG with nonspecific ST changes, troponins negative.  X-ray positive for multifocal Covid pneumonia.  Patient admitted for acute hypoxic respiratory failure in setting of Covid pneumonia.  Meds:  Current Meds  Medication Sig  . amLODipine (NORVASC) 10 MG tablet Take 1 tablet (10 mg total) by mouth daily.  Marland Kitchen atorvastatin (LIPITOR) 20 MG tablet Take 1 tablet  (20 mg total) by mouth daily.  . canagliflozin (INVOKANA) 300 MG TABS tablet Take 1 tablet (300 mg total) by mouth daily before breakfast.  . losartan (COZAAR) 25 MG tablet Take 1 tablet (25 mg total) by mouth daily.  . metFORMIN (GLUCOPHAGE) 1000 MG tablet Take 1 tablet (1,000 mg total) by mouth 2 (two) times daily with a meal.  . metoprolol succinate (TOPROL-XL) 100 MG 24 hr tablet Take 1 tablet (100 mg total) by mouth daily. Take with or immediately following a meal.  . traZODone (DESYREL) 100 MG tablet Take 1 tablet (100 mg total) by mouth at bedtime. Break in half for first week, if still unable to sleep start taking whole pill.   Allergies: Allergies as of 04/06/2020  . (No Known Allergies)   Past Medical History:  Diagnosis Date  . Arthritis   . Diabetes ([place])   . Hypertension   . Neuropathy     Family History:  Family History  Problem Relation Age of Onset  . Diabetes Mother   . Heart failure Mother   . CAD Mother   . Kidney disease Mother   . Diabetes Sister    Social History: Patient lives at home by herself and is independent in her ADLs.  She has a daughter that lives close by.  Patient has prior history of tobacco use disorder smoking 1 pack/day for over 20 years.  She denies any alcohol use or illicit drug  use.  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  Review of Systems: A complete ROS was negative except as per HPI.   Physical Exam: Blood pressure 128/68, pulse 74, temperature 99 F (37.2 C), resp. rate (!) 22, SpO2 95 %. Physical Exam  Constitutional: Obese middle-aged female, no acute distress HENT: Normocephalic and atraumatic, EOMI, conjunctiva normal, moist mucous membranes Cardiovascular: Normal rate, regular rhythm, S1 and S2 present, no murmurs, rubs, gallops.  Distal pulses intact Respiratory: No respiratory distress, no accessory muscle use.  Mild tachypnea.  On 3 L oxygen satting greater than 90%.  Diffuse  crackles on auscultation. GI: Nondistended, soft, nontender to palpation, normal active bowel sounds Musculoskeletal: Normal bulk and tone.  No peripheral edema noted. Neurological: Is alert and oriented x4, no apparent focal deficits noted. Skin: Warm and dry.  No rash, erythema, lesions noted. Psychiatric: Normal mood and affect. Behavior is normal. Judgment and thought content normal.   CBC    Component Value Date/Time   WBC 9.2 04/06/2020 1308   RBC 5.60 (H) 04/06/2020 1308   HGB 14.6 04/06/2020 1308   HGB 14.3 12/12/2018 1614   HCT 43.2 04/06/2020 1308   HCT 43.0 12/12/2018 1614   PLT 319 04/06/2020 1308   PLT 276 12/12/2018 1614   MCV 77.1 (L) 04/06/2020 1308   MCV 82 12/12/2018 1614   MCH 26.1 04/06/2020 1308   MCHC 33.8 04/06/2020 1308   RDW 15.4 04/06/2020 1308   RDW 16.6 (H) 12/12/2018 1614   LYMPHSABS 3,452 12/07/2019 0856   LYMPHSABS 4.6 (H) 12/12/2018 1614   EOSABS 229 12/07/2019 0856   EOSABS 0.2 12/12/2018 1614   BASOSABS 47 12/07/2019 0856   BASOSABS 0.1 12/12/2018 1614   BMP Latest Ref Rng & Units 04/06/2020 04/27/2019 12/12/2018  Glucose 70 - 99 mg/dL 185(H) 116(H) 129(H)  BUN 6 - 20 mg/dL 7 6 12   Creatinine 0.44 - 1.00 mg/dL 0.71 0.57 0.59  BUN/Creat Ratio 9 - 23 - - 20  Sodium 135 - 145 mmol/L 138 139 140  Potassium 3.5 - 5.1 mmol/L 2.9(L) 3.3(L) 4.1  Chloride 98 - 111 mmol/L 95(L) 103 102  CO2 22 - 32 mmol/L 27 27 22   Calcium 8.9 - 10.3 mg/dL 8.8(L) 8.8(L) 9.9   COVID-19 Labs  Recent Labs    04/06/20 1530  DDIMER 0.51*  FERRITIN 103  LDH 293*  CRP 8.0*    Lab Results  Component Value Date   SARSCOV2NAA POSITIVE (A) 04/06/2020    EKG: personally reviewed my interpretation is nonspecific ST segment changes in inferior and anterior lateral leads.  CXR: bilateral patchy consolidation with lower lobe and peripheral predominance, multifocal COVID pneumonia; cardiomegaly   Assessment & Plan by Problem: Active Problems:   Pneumonia due to  COVID-19 virus [name] is a 60 year old female with a past medical history of diabetes, hypertension, chronic pain and hyperlipidemia presenting with acute hypoxic respiratory failure secondary to Covid pneumonia.  Acute hypoxic respiratory failure in setting of Covid pneumonia Patient presented with 2 weeks of upper respiratory symptoms with progressively worsening shortness of breath in setting of Covid pneumonia.  Patient tested positive on 2/2 for Covid.  Initially saturating 88% on room air.  Improved to greater than 90% on 3 L nasal cannula.  Chest x-ray and inflammatory labs consistent with multifocal Covid pneumonia.  -Started on remdesivir and dexamethasone -Tussionex and Robitussin as needed for cough -Oxygen supplementation to maintain SpO2 > 90% -Trend  inflammatory markers -ICS and flutter valve  Hypokalemia Patient noted to be hypokalemic to 2.9 on initial BMP.  Suspect this is secondary to decreased oral intake and increased GI losses. Patient given oral potassium -Follow-up BMP in a.m. -  Diabetes mellitus Most recent A1c 7.4 in December 2021.  Patient is on Metformin 1000 mg twice daily and Invokana 300 mg daily. -SSI resistant -Tradjenta 5 mg daily -CBG monitoring 4 times daily  Hypertension Continue home amlodipine and metoprolol   Dispo: Admit patient to Inpatient with expected length of stay greater than 2 midnights.  Signed: Harvie Heck, MD  [facility] PGY-2 04/06/2020, 3:11 PM  Pager: [phone] After 5pm on weekdays and 1pm on weekends: On Call pager: [phone]

## 2020-04-06 NOTE — ED Provider Notes (Addendum)
[facility] EMERGENCY DEPARTMENT Provider Note   CSN: [id number] Arrival date & time: 04/06/20  1240    History Chief Complaint  Patient presents with  . Covid Positive  . Chest Pain    Ruth Blackburn is a 60 y.o. female with history significant for diabetes, obesity, hypertension, hyperlipidemia who presents for evaluation of shortness of breath. Patient states approximately 2 weeks ago she developed congestion and rhinorrhea. Also developed cough. Patient states cough is worsened. She has had some shortness of breath which is chronic. States she has had multiple episodes of NBNB emesis. States she developed some central chest pain when she had some emesis. Patient states she has no current chest pain. She has no exertional or pleuritic chest pain. No hemoptysis. No unilateral leg swelling, redness or warmth. No fever, chills, abdominal pain, diarrhea, dysuria, lateral weakness, paresthesias. Has not take anything for symptoms. She was [facility] on [facility] received a positive test  Yesterday. Denies additional aggravating or alleviating factors.  History obtained from patient and past medical records. No interpreter used.  COVID test I care everywhere   HPI     Past Medical History:  Diagnosis Date  . Arthritis   . Diabetes ([place])   . Hypertension   . Neuropathy     Patient Active Problem List   Diagnosis Date Noted  . Pneumonia due to COVID-19 virus 04/06/2020  . Tear of right supraspinatus tendon 02/27/2020  . Chronic pain (Neck, Shoulder, Arms) 10/19/2019  . Ganglion cyst 10/19/2019  . Chest pain 04/27/2019  . Plantar fasciitis 02/01/2019  . Onychomycosis 02/01/2019  . Diabetes ([place]) 12/12/2018  . Arthritis 12/12/2018  . Hypertension 12/12/2018  . Neuropathy 12/12/2018  . Insomnia 12/12/2018  . Severe obesity (BMI >= 40) ([place]) 12/12/2018  . Hypercholesterolemia 02/12/2015  . Healthcare maintenance 02/01/2015  . Primary  osteoarthritis of both knees 01/29/2015  . Migraine 11/18/2011    Past Surgical History:  Procedure Laterality Date  . ABDOMINAL HYSTERECTOMY  2006  . BREAST REDUCTION SURGERY  2004  . REDUCTION MAMMAPLASTY Bilateral 2004     OB History   No obstetric history on file.     Family History  Problem Relation Age of Onset  . Diabetes Mother   . Heart failure Mother   . CAD Mother   . Kidney disease Mother   . Diabetes Sister     Social History   Tobacco Use  . Smoking status: Former Smoker    Quit date: 03/02/2008    Years since quitting: 12.1  . Smokeless tobacco: Never Used    Home Medications Prior to Admission medications   Medication Sig Start Date End Date Taking? Authorizing Provider  amLODipine (NORVASC) 10 MG tablet Take 1 tablet (10 mg total) by mouth daily. 12/27/18  Yes Helberg, Larkin Ina, MD  atorvastatin (LIPITOR) 20 MG tablet Take 1 tablet (20 mg total) by mouth daily. 02/27/20  Yes Seawell, Jaimie A, DO  canagliflozin (INVOKANA) 300 MG TABS tablet Take 1 tablet (300 mg total) by mouth daily before breakfast. 10/19/19  Yes Katsadouros, Vasilios, MD  losartan (COZAAR) 25 MG tablet Take 1 tablet (25 mg total) by mouth daily. 12/05/19 12/04/20 Yes Aslam, Loralyn Freshwater, MD  metFORMIN (GLUCOPHAGE) 1000 MG tablet Take 1 tablet (1,000 mg total) by mouth 2 (two) times daily with a meal. 12/15/19  Yes Mosetta Anis, MD  metoprolol succinate (TOPROL-XL) 100 MG 24 hr tablet Take 1 tablet (100 mg total) by mouth daily. Take  with or immediately following a meal. 12/28/18  Yes Helberg, Larkin Ina, MD  traZODone (DESYREL) 100 MG tablet Take 1 tablet (100 mg total) by mouth at bedtime. Break in half for first week, if still unable to sleep start taking whole pill. 02/27/20  Yes Seawell, Jaimie A, DO  fluconazole (DIFLUCAN) 150 MG tablet Take 1 tablet (150 mg total) by mouth once a week. Patient not taking: Reported on 04/06/2020 05/25/19   Trula Slade, DPM  naproxen (NAPROSYN) 500 MG tablet  Take 1 tablet (500 mg total) by mouth 2 (two) times daily as needed for moderate pain. 02/27/20   Seawell, Jaimie A, DO    Allergies    Patient has no known allergies.  Review of Systems   Review of Systems  Constitutional: Positive for activity change, appetite change and fatigue.  HENT: Positive for congestion and rhinorrhea.   Respiratory: Positive for cough and shortness of breath. Negative for apnea, choking, chest tightness, wheezing and stridor.   Cardiovascular: Positive for chest pain (Resolved).  Gastrointestinal: Positive for nausea and vomiting. Negative for abdominal distention, abdominal pain, anal bleeding, blood in stool, constipation, diarrhea and rectal pain.  Genitourinary: Negative.   Musculoskeletal: Negative.   Skin: Negative.   Neurological: Positive for weakness (Generalized). Negative for dizziness, facial asymmetry, light-headedness, numbness and headaches.  All other systems reviewed and are negative.   Physical Exam Updated Vital Signs BP 128/68   Pulse 74   Temp 99 F (37.2 C)   Resp (!) 22   SpO2 95%   Physical Exam Vitals and nursing note reviewed.  Constitutional:      General: She is not in acute distress.    Appearance: She is well-developed and well-nourished. She is obese. She is ill-appearing. She is not toxic-appearing or diaphoretic.  HENT:     Head: Normocephalic and atraumatic.  Eyes:     Pupils: Pupils are equal, round, and reactive to light.  Cardiovascular:     Rate and Rhythm: Normal rate.     Pulses: Intact distal pulses.          Radial pulses are 2+ on the right side and 2+ on the left side.       Dorsalis pedis pulses are 2+ on the right side and 2+ on the left side.     Heart sounds: Normal heart sounds.  Pulmonary:     Effort: Pulmonary effort is normal. No respiratory distress.     Breath sounds: Normal breath sounds and air entry.     Comments: Speaks in full sentences without difficulty, on 2 L via nasal  cannula Chest:     Comments: Equal rise and fall to chest wall Abdominal:     General: Bowel sounds are normal. There is no distension.     Palpations: Abdomen is soft.     Tenderness: There is no abdominal tenderness. There is no right CVA tenderness, left CVA tenderness, guarding or rebound.     Hernia: No hernia is present.     Comments: Soft, nontender.  No rebound or guarding  Musculoskeletal:        General: Normal range of motion.     Cervical back: Normal range of motion.     Right lower leg: No tenderness. No edema.     Left lower leg: No tenderness. No edema.     Comments: Moves all 4 extremities at difficulty.  No bony tenderness  Skin:    General: Skin is warm and dry.  Capillary Refill: Capillary refill takes less than 2 seconds.     Comments: No edema, erythema or warmth.  No fluctuance or induration  Neurological:     General: No focal deficit present.     Mental Status: She is alert.     Cranial Nerves: Cranial nerves are intact.     Sensory: Sensation is intact.     Motor: Motor function is intact.     Gait: Gait is intact.     Comments: Cranial nerves II through grossly intact  Psychiatric:        Mood and Affect: Mood and affect normal.    ED Results / Procedures / Treatments   Labs (all labs ordered are listed, but only abnormal results are displayed) Labs Reviewed  BASIC METABOLIC PANEL - Abnormal; Notable for the following components:      Result Value   Potassium 2.9 (*)    Chloride 95 (*)    Glucose, Bld 185 (*)    Calcium 8.8 (*)    Anion gap 16 (*)    All other components within normal limits  CBC - Abnormal; Notable for the following components:   RBC 5.60 (*)    MCV 77.1 (*)    All other components within normal limits  CULTURE, BLOOD (ROUTINE X 2)  CULTURE, BLOOD (ROUTINE X 2)  LACTIC ACID, PLASMA  LACTIC ACID, PLASMA  D-DIMER, QUANTITATIVE (NOT AT [facility])  PROCALCITONIN  LACTATE DEHYDROGENASE  FERRITIN  FIBRINOGEN  C-REACTIVE  PROTEIN  HEPATIC FUNCTION PANEL  TRIGLYCERIDES  I-STAT BETA HCG BLOOD, ED ([facility], [facility], [facility] ONLY)  TROPONIN I (HIGH SENSITIVITY)  TROPONIN I (HIGH SENSITIVITY)    EKG EKG Interpretation  Date/Time:  Saturday April 06 2020 12:50:49 EST Ventricular Rate:  91 PR Interval:  152 QRS Duration: 86 QT Interval:  362 QTC Calculation: 445 R Axis:   54 Text Interpretation: Normal sinus rhythm new ST & T wave abnormality, consider inferior ischemia new ST & T wave abnormality, consider anterolateral ischemia Confirmed by Blanchie Dessert [phone]) on 04/06/2020 2:26:17 PM   Radiology DG Chest Portable 1 View  Result Date: 04/06/2020 CLINICAL DATA:  Chest pain. Positive COVID. EXAM: PORTABLE CHEST 1 VIEW COMPARISON:  April 27, 2019 FINDINGS: Enlarged cardiac silhouette. Calcific atherosclerotic disease of the aorta. Low lung volumes with bilateral patchy airspace consolidation with lower lobe and peripheral predominance. Osseous structures are without acute abnormality. Soft tissues are grossly normal. IMPRESSION: 1. Bilateral patchy airspace consolidation with lower lobe and peripheral predominance, consistent with multifocal COVID pneumonia. 2. Enlarged cardiac silhouette. Electronically Signed   By: Fidela Salisbury M.D.   On: 04/06/2020 13:44    Procedures .Critical Care Performed by: Nettie Elm, PA-C Authorized by: Nettie Elm, PA-C   Critical care provider statement:    Critical care time (minutes):  45   Critical care was necessary to treat or prevent imminent or life-threatening deterioration of the following conditions:  Respiratory failure (EKG changes )   Critical care was time spent personally by me on the following activities:  Discussions with consultants, evaluation of patient's response to treatment, examination of patient, ordering and performing treatments and interventions, ordering and review of laboratory studies, ordering and review of radiographic studies,  pulse oximetry, re-evaluation of patient's condition, obtaining history from patient or surrogate and review of old charts     Medications Ordered in ED Medications  enoxaparin (LOVENOX) injection 40 mg (has no administration in time range)  remdesivir 200 mg in sodium chloride  0.9% 250 mL IVPB (has no administration in time range)    Followed by  remdesivir 100 mg in sodium chloride 0.9 % 100 mL IVPB (has no administration in time range)  dexamethasone (DECADRON) injection 6 mg (6 mg Intravenous Given 04/06/20 1528)  guaiFENesin-dextromethorphan (ROBITUSSIN DM) 100-10 MG/5ML syrup 10 mL (has no administration in time range)  chlorpheniramine-HYDROcodone (TUSSIONEX) 10-8 MG/5ML suspension 5 mL (has no administration in time range)  linagliptin (TRADJENTA) tablet 5 mg (has no administration in time range)  insulin aspart (novoLOG) injection 0-15 Units (has no administration in time range)  pantoprazole (PROTONIX) EC tablet 40 mg (has no administration in time range)  acetaminophen (TYLENOL) tablet 650 mg (has no administration in time range)  senna-docusate (Senokot-S) tablet 1 tablet (has no administration in time range)  ondansetron (ZOFRAN) tablet 4 mg (has no administration in time range)    Or  ondansetron (ZOFRAN) injection 4 mg (has no administration in time range)  ondansetron (ZOFRAN) injection 4 mg (4 mg Intravenous Given 04/06/20 1528)  sodium chloride 0.9 % bolus 500 mL (500 mLs Intravenous New Bag/Given 04/06/20 1529)  potassium chloride SA (KLOR-CON) CR tablet 40 mEq (40 mEq Oral Given 04/06/20 1529)   ED Course  I have reviewed the triage vital signs and the nursing notes.  Pertinent labs & imaging results that were available during my care of the patient were reviewed by me and considered in my medical decision making (see chart for details).  60 year old presents for evaluation of shortness of breath and cough.  Known Covid positive, diagnosed 3 days ago.  She is afebrile,  nonseptic appearing however does appear ill.  Unfortunate was hypoxic when she arrived emergency department is requiring supplemental oxygen.  Patient did note to nursing that she had some chest pain while she had some episodes of NBNB emesis.  She is currently chest pain-free.  She denies any exertional or pleuritic chest pain.  When pain was occurring it did not radiate to her left arm, jaw or back.  She has no clinical evidence of DVT on exam.  Her abdomen is soft, nontender.  Work-up started from triage.  Labs and imaging personally reviewed and interpreted:  CBC without leukocytosis Metabolic panel with mild hypokalemia to  2.9, will supplement PO, glucose 185, anion gap 16, likely due to her emesis.  Will give small fluid bolus Chest x-ray consistent with multifocal pneumonia, likely Covid pneumonia, no cardiomegaly, pulmonary edema, widened mediastinum Trop 5 EKG does show some ST abnormality however no STEMI.  Patient is currently chest pain-free.  Patient will need to be admitted for acute hypoxic respiratory failure likely due to her Covid pneumonia.  I have  low suspicion for ACS, PE, dissection or bacterial source as a cause of her hypoxia.  She has normal mentation.  She does have some ST changes on EKG however troponin is reassuring.  Will certainly need to be trended while inpatient with close monitoring.  CONSULT with IM teaching service who is agreeable to evaluate patient for admission.  The patient appears reasonably stabilized for admission considering the current resources, flow, and capabilities available in the ED at this time, and I doubt any other [facility] requiring further screening and/or treatment in the ED prior to admission.  Patient discussed with attending, Dr. Maryan Rued who agrees with above treatment, plan disposition    MDM Rules/Calculators/A&P  Reece Charles was evaluated in Emergency Department on 04/06/2020 for the symptoms described in the  history of present illness. She was evaluated in the context of the global COVID-19 pandemic, which necessitated consideration that the patient might be at risk for infection with the SARS-CoV-2 virus that causes COVID-19. Institutional protocols and algorithms that pertain to the evaluation of patients at risk for COVID-19 are in a state of rapid change based on information released by regulatory bodies including the CDC and federal and state organizations. These policies and algorithms were followed during the patient's care in the ED. Final Clinical Impression(s) / ED Diagnoses Final diagnoses:  COVID  Acute respiratory failure with hypoxia ([place])  Hypokalemia  EKG abnormalities    Rx / DC Orders ED Discharge Orders    None       Durene Dodge A, PA-C 04/06/20 1532    Quenesha Douglass A, PA-C 04/06/20 1534    Blanchie Dessert, MD 04/07/20 2052

## 2020-04-06 NOTE — ED Notes (Signed)
Received call from lab, pt is covid positive

## 2020-04-06 NOTE — ED Triage Notes (Signed)
Pt reports cold symptoms x 2 weeks.  COVID test + on Wednesday.  Reports fatigue, loss of taste/smell, generalized weakness, pain to center of chest, and SOB.    Sats 88% on room air.  Pt placed on 2 liters NRB.

## 2020-04-06 NOTE — ED Notes (Signed)
I sent a note to pharmacy to please tube the remdesivir

## 2020-04-07 DIAGNOSIS — J1282 Pneumonia due to coronavirus disease 2019: Secondary | ICD-10-CM | POA: Diagnosis not present

## 2020-04-07 DIAGNOSIS — U071 COVID-19: Secondary | ICD-10-CM | POA: Diagnosis not present

## 2020-04-07 LAB — C-REACTIVE PROTEIN: CRP: 6.6 mg/dL — ABNORMAL HIGH (ref <1.0)

## 2020-04-07 LAB — CBC WITH DIFFERENTIAL/PLATELET
Abs Immature Granulocytes: 0.1 10*3/uL — ABNORMAL HIGH (ref 0.00–0.07)
Basophils Absolute: 0 10*3/uL (ref 0.0–0.1)
Basophils Relative: 0 %
Eosinophils Absolute: 0 10*3/uL (ref 0.0–0.5)
Eosinophils Relative: 0 %
HCT: 39.5 % (ref 36.0–46.0)
Hemoglobin: 14 g/dL (ref 12.0–15.0)
Immature Granulocytes: 1 %
Lymphocytes Relative: 26 %
Lymphs Abs: 1.9 10*3/uL (ref 0.7–4.0)
MCH: 27.1 pg (ref 26.0–34.0)
MCHC: 35.4 g/dL (ref 30.0–36.0)
MCV: 76.4 fL — ABNORMAL LOW (ref 80.0–100.0)
Monocytes Absolute: 0.5 10*3/uL (ref 0.1–1.0)
Monocytes Relative: 7 %
Neutro Abs: 4.6 10*3/uL (ref 1.7–7.7)
Neutrophils Relative %: 66 %
Platelets: 329 10*3/uL (ref 150–400)
RBC: 5.17 MIL/uL — ABNORMAL HIGH (ref 3.87–5.11)
RDW: 15.3 % (ref 11.5–15.5)
WBC: 7.2 10*3/uL (ref 4.0–10.5)
nRBC: 0 % (ref 0.0–0.2)

## 2020-04-07 LAB — COMPREHENSIVE METABOLIC PANEL
ALT: 26 U/L (ref 0–44)
AST: 24 U/L (ref 15–41)
Albumin: 3 g/dL — ABNORMAL LOW (ref 3.5–5.0)
Alkaline Phosphatase: 62 U/L (ref 38–126)
Anion gap: 12 (ref 5–15)
BUN: 7 mg/dL (ref 6–20)
CO2: 26 mmol/L (ref 22–32)
Calcium: 8.6 mg/dL — ABNORMAL LOW (ref 8.9–10.3)
Chloride: 97 mmol/L — ABNORMAL LOW (ref 98–111)
Creatinine, Ser: 0.67 mg/dL (ref 0.44–1.00)
GFR, Estimated: >60 mL/min (ref >60)
Glucose, Bld: 286 mg/dL — ABNORMAL HIGH (ref 70–99)
Potassium: 3.3 mmol/L — ABNORMAL LOW (ref 3.5–5.1)
Sodium: 135 mmol/L (ref 135–145)
Total Bilirubin: 0.7 mg/dL (ref 0.3–1.2)
Total Protein: 7.9 g/dL (ref 6.5–8.1)

## 2020-04-07 LAB — D-DIMER, QUANTITATIVE: D-Dimer, Quant: 0.38 ug/mL-FEU (ref 0.00–0.50)

## 2020-04-07 LAB — HEMOGLOBIN A1C
Hgb A1c MFr Bld: 7.6 % — ABNORMAL HIGH (ref 4.8–5.6)
Mean Plasma Glucose: 171.42 mg/dL

## 2020-04-07 LAB — HIV ANTIBODY (ROUTINE TESTING W REFLEX): HIV Screen 4th Generation wRfx: NONREACTIVE

## 2020-04-07 LAB — GLUCOSE, CAPILLARY
Glucose-Capillary: 228 mg/dL — ABNORMAL HIGH (ref 70–99)
Glucose-Capillary: 184 mg/dL — ABNORMAL HIGH (ref 70–99)
Glucose-Capillary: 184 mg/dL — ABNORMAL HIGH (ref 70–99)

## 2020-04-07 LAB — FERRITIN: Ferritin: 100 ng/mL (ref 11–307)

## 2020-04-07 LAB — MAGNESIUM: Magnesium: 2.2 mg/dL (ref 1.7–2.4)

## 2020-04-07 MED ORDER — PANTOPRAZOLE SODIUM 40 MG PO TBEC: 40.0000 mg | DELAYED_RELEASE_TABLET | Freq: Every day | ORAL | 0 refills | Status: DC

## 2020-04-07 MED ORDER — DEXAMETHASONE 6 MG PO TABS: 6.0000 mg | ORAL_TABLET | Freq: Every day | ORAL | 0 refills | Status: AC

## 2020-04-07 MED ORDER — INSULIN ASPART 100 UNIT/ML ~~LOC~~ SOLN
0.0000 [IU] | Freq: Every day | SUBCUTANEOUS | Status: DC
  Administered 2020-04-07: 2 [IU] via SUBCUTANEOUS

## 2020-04-07 MED ORDER — GUAIFENESIN-DM 100-10 MG/5ML PO SYRP: 10.0000 mL | ORAL_SOLUTION | ORAL | 0 refills | Status: DC | PRN

## 2020-04-07 MED ORDER — POTASSIUM CHLORIDE CRYS ER 20 MEQ PO TBCR
40.0000 meq | EXTENDED_RELEASE_TABLET | Freq: Once | ORAL | Status: AC
  Administered 2020-04-07: 40 meq via ORAL
  Filled 2020-04-07: qty 2

## 2020-04-07 NOTE — TOC Transition Note (Signed)
Transition of Care [facility]) - CM/SW Discharge Note   Patient Details  Name: Ruth Blackburn MRN: [id number] Date of Birth: [birth year]  [facility] CM/SW Contact:  Carles Collet, RN Phone Number: 04/07/2020, 1:19 PM   Clinical Narrative:   [employer] liaison following for home oxygen needs. Tanks to be delivered to the unit pending correction of ambulatory sat note, and placement of DME O2 order.  Patient states relative will provide transportation home     Final next level of care: Home/Self Care     Patient Goals and CMS Choice        Discharge Placement                       Discharge Plan and Services                DME Arranged: Oxygen DME Agency:  ([employer])  Spoke w Brenton Grills 13:20                  Social Determinants of Health (SDOH) Interventions     Readmission Risk Interventions No flowsheet data found.

## 2020-04-07 NOTE — Progress Notes (Signed)
SATURATION QUALIFICATIONS: (This note is used to comply with regulatory documentation for home oxygen)  Patient Saturations on Room Air at Rest = 86%  Patient Saturations on Room Air while Ambulating = 80%  Patient Saturations on 4 Liters of oxygen while Ambulating = 91%  Please briefly explain why patient needs home oxygen: Patient needs home Oxygen while ambulating to maintain safe Oxygen Saturation levels.

## 2020-04-07 NOTE — Care Management CC44 (Signed)
Condition Code 44 Documentation Completed  Patient Details  Name: Ruth Blackburn MRN: [id number] Date of Birth: 22-May-1960   Condition Code 44 given:  Yes Patient signature on Condition Code 44 notice:  Yes Documentation of 2 MD's agreement:  Yes Code 44 added to claim:  Yes    Carles Collet, RN 04/07/2020, 1:09 PM

## 2020-04-07 NOTE — Discharge Summary (Signed)
Name: Ruth Blackburn MRN: [id number] DOB: [birth year] 60 y.o. PCP: Ruth Pope, MD  Date of Admission: 04/06/2020 12:43 PM Date of Discharge: 04/08/2019 Attending Physician: Axel Filler, *  Discharge Diagnosis: 1. COVID-19 Pneumonia 2. Diabetes mellitus 3. Hypertension   Discharge Medications: Allergies as of 04/07/2020   No Known Allergies     Medication List    STOP taking these medications   fluconazole 150 MG tablet Commonly known as: DIFLUCAN     TAKE these medications   amLODipine 10 MG tablet Commonly known as: NORVASC Take 1 tablet (10 mg total) by mouth daily.   atorvastatin 20 MG tablet Commonly known as: LIPITOR Take 1 tablet (20 mg total) by mouth daily.   canagliflozin 300 MG Tabs tablet Commonly known as: Invokana Take 1 tablet (300 mg total) by mouth daily before breakfast.   dexamethasone 6 MG tablet Commonly known as: DECADRON Take 1 tablet (6 mg total) by mouth daily for 9 days.   guaiFENesin-dextromethorphan 100-10 MG/5ML syrup Commonly known as: ROBITUSSIN DM Take 10 mLs by mouth every 4 (four) hours as needed for cough.   losartan 25 MG tablet Commonly known as: Cozaar Take 1 tablet (25 mg total) by mouth daily.   metFORMIN 1000 MG tablet Commonly known as: GLUCOPHAGE Take 1 tablet (1,000 mg total) by mouth 2 (two) times daily with a meal.   metoprolol succinate 100 MG 24 hr tablet Commonly known as: TOPROL-XL Take 1 tablet (100 mg total) by mouth daily. Take with or immediately following a meal.   naproxen 500 MG tablet Commonly known as: Naprosyn Take 1 tablet (500 mg total) by mouth 2 (two) times daily as needed for moderate pain.   pantoprazole 40 MG tablet Commonly known as: PROTONIX Take 1 tablet (40 mg total) by mouth daily for 9 days. Start taking on: April 08, 2020   traZODone 100 MG tablet Commonly known as: DESYREL Take 1 tablet (100 mg total) by mouth at bedtime. Break in half for first week, if  still unable to sleep start taking whole pill.            Durable Medical Equipment  (From admission, onward)         Start     Ordered   04/07/20 1519  DME Oxygen  Once       Question Answer Comment  Length of Need 6 Months   Mode or (Route) Nasal cannula   Liters per Minute 4   Frequency Continuous (stationary and portable oxygen unit needed)   Oxygen conserving device Yes   Oxygen delivery system Gas      04/07/20 1523          Disposition and follow-up:   [name] was discharged from [facility] in Stable condition.  At the hospital follow up visit please address:  1.  COVID19 Pneumonia - Please make sure patient has gotten her oxygen and is using it during ambulation  - Patient to take dexamethasone 6mg  daily for 9 days to complete 10 day course - Robitussin DM q4h prn for cough  - Recommend booster shot   Diabetes mellitus:  - HbA1c 7.4 in December 2021 - On Metformin 1000mg  bid and Invokana 300mg  daily  Hypertension - Continue amlodipine 10mg  daily and Toprol XL 100mg  daily  2.  Labs / imaging needed at time of follow-up: HbA1c in 3 months  3.  Pending labs/ test needing follow-up: None  Follow-up Appointments:  Follow-up Information  Ruth Pope, MD. Schedule an appointment as soon as possible for a visit in 1 week(s).   Specialty: Internal Medicine Contact information: [place] [id number] [phone]              Admission HPI: [name] is a 60 year old female with PMHx of diabetes, hypertension, and hyperlipidemia presenting with shortness of breath.  Patient endorses congestion and rhinorrhea with productive cough for approximately 2 weeks.  She initially thought this was her allergies and symptomatically treated at home.  However, notes worsening shortness of breath secondary to worsening cough.  She is also noted ongoing nausea with decreased appetite and dysgeusia with  intermittent episodes of nonbloody nonbilious emesis.  She was tested for Covid on Wednesday via [facility] and noted that she was positive.  Patient does endorse some chest pain that is worsened with coughing.  Denies any ongoing chest pain at this time.  He denies any fevers or chills, abdominal pain, diarrhea, dysuria.  Hospital Course by problem list: 1. COVID-19 Pneumonia Patient presented with acute hypoxic respiratory failure in setting of COVID-19 pneumonia.  Patient with 2 weeks of upper respiratory symptoms, tested positive for Covid on 2/2 and worsening shortness of breath and fatigue.  Oxygen saturation 98% on room air on presentation, improved to 95% with 2 to 3 L of oxygen.  Noted to have CXR consistent with multi focal Covid pneumonia most significant at lower lobes and periphery and elevated inflammatory markers.  Patient started on remdesivir and dexamethasone.  Significant improvement in symptoms with 1 day of treatment.  Patient was able to ambulate with only requiring 4 L of oxygen to maintain saturations greater than 89%.  Patient is eager to discharge to home today.  Commend continuing dexamethasone 6 mg daily on discharge to complete 10-day course to help decrease overall inflammation and oxygen requirements.  Also prescribed Protonix 40 mg daily to prevent peptic ulcers.  Follow-up, patient will need to monitoring of her symptoms and evaluation of ambulatory pulse ox.  2. Diabetes mellitus: Patient's most recent hemoglobin A1c 7.4 in December 2021.  She is on Metformin 1000 mg twice daily and Invokana 300 mg daily.  I expect that she will have a transient blood sugar spike in setting of steroid use.  Patient recommended to continue her diabetes regimen.  Follow-up with A1c in 3 months  3. Hypertension She is on amlodipine and metoprolol.  Patient instructed to continue this on discharge.  Discharge Exam:   BP 109/68 (BP Location: Left Wrist)   Pulse 71   Temp 98.8 F (37.1 C) (Oral)    Resp 20   SpO2 96%  Discharge exam: Constitutional: Obese middle-aged female, no acute distress HENT: Normocephalic and atraumatic, EOMI, conjunctiva normal, moist mucous membranes Cardiovascular: Normal rate, regular rhythm, S1 and S2 present, no murmurs, rubs, gallops.  Distal pulses intact Respiratory: No respiratory distress, no accessory muscle use.  Effort is normal.  On 2 L oxygen, satting greater than 90%.  Minimal diffuse crackles on auscultation GI: Nondistended, soft, nontender to palpation, active bowel sounds Musculoskeletal: Normal bulk and tone.  No peripheral edema noted. Neurological: Is alert and oriented x4, no apparent focal deficits noted. Skin: Warm and dry.  No rash, erythema, lesions noted.  Pertinent Labs, Studies, and Procedures:  CBC Latest Ref Rng & Units 04/07/2020 04/06/2020 12/07/2019  WBC 4.0 - 10.5 K/uL 7.2 9.2 7.9  Hemoglobin 12.0 - 15.0 g/dL 14.0 14.6 14.4  Hematocrit 36.0 - 46.0 %  39.5 43.2 45.3(H)  Platelets 150 - 400 K/uL 329 319 282   BMP Latest Ref Rng & Units 04/07/2020 04/06/2020 04/27/2019  Glucose 70 - 99 mg/dL 286(H) 185(H) 116(H)  BUN 6 - 20 mg/dL 7 7 6   Creatinine 0.44 - 1.00 mg/dL 0.67 0.71 0.57  BUN/Creat Ratio 9 - 23 - - -  Sodium 135 - 145 mmol/L 135 138 139  Potassium 3.5 - 5.1 mmol/L 3.3(L) 2.9(L) 3.3(L)  Chloride 98 - 111 mmol/L 97(L) 95(L) 103  CO2 22 - 32 mmol/L 26 27 27   Calcium 8.9 - 10.3 mg/dL 8.6(L) 8.8(L) 8.8(L)   COVID-19 Labs  Recent Labs    04/06/20 1530 04/07/20 0302  DDIMER 0.51* 0.38  FERRITIN 103 100  LDH 293*  --   CRP 8.0* 6.6*    Lab Results  Component Value Date   SARSCOV2NAA POSITIVE (A) 04/06/2020   CHEST X-RAY 04/06/2020: IMPRESSION: 1. Bilateral patchy airspace consolidation with lower lobe and peripheral predominance, consistent with multifocal COVID pneumonia. 2. Enlarged cardiac silhouette.  Discharge Instructions: Discharge Instructions    Call MD for:  difficulty breathing, headache or visual  disturbances   Complete by: As directed    Call MD for:  extreme fatigue   Complete by: As directed    Call MD for:  persistant dizziness or light-headedness   Complete by: As directed    Call MD for:  persistant nausea and vomiting   Complete by: As directed    Call MD for:  temperature >100.4   Complete by: As directed    Diet - low sodium heart healthy   Complete by: As directed    Discharge instructions   Complete by: As directed    [name],  You were admitted to the hospital with worsening shortness of breath and cough in setting of COVID pneumonia. You improved with remdesivir and dexamethasone. However, continued to have up to 4L of oxygen requirement with walking. On discharge, please continue to use the oxygen while walking. We anticipate that your oxygen requirements will decrease over the next 1-2 weeks as you continue to improve.  Please continue taking dexamethasone 6mg  daily until completion to help with the inflammation in your lungs. I would also recommend taking pantoprazole 40mg  daily while you are taking the dexamethasone to prevent peptic ulcers.  As discussed, we recommend isolation for ~10 days on discharge. Please schedule a follow up telehealth appointment in the clinic within 1 week.   Thank you!   Increase activity slowly   Complete by: As directed       Signed: Harvie Heck, MD  [facility] PGY-2 04/07/2020, 3:52 PM   Pager: [phone]

## 2020-04-07 NOTE — Care Management Obs Status (Signed)
[place] NOTIFICATION   Patient Details  Name: Ruth Blackburn MRN: [id number] Date of Birth: 02-Feb-1961   Medicare Observation Status Notification Given:  Yes    Carles Collet, RN 04/07/2020, 1:09 PM

## 2020-04-07 NOTE — Progress Notes (Deleted)
SATURATION QUALIFICATIONS: (This note is used to comply with regulatory documentation for home oxygen)  Patient Saturations on Room Air at Rest = 88%  Patient Saturations on Room Air while Ambulating = 82%  Patient Saturations on 2 Liters of oxygen while Ambulating = 84%  Please briefly explain why patient needs home oxygen: Patient needs home O2 while ambulating to maintain safel O2 levels.

## 2020-04-08 ENCOUNTER — Telehealth: Payer: Self-pay | Admitting: Student

## 2020-04-08 NOTE — Telephone Encounter (Signed)
TOC HFU [facility] FOR 04/16/2020 @ 2:45 PM WITH DR Johnney Ou

## 2020-04-11 LAB — CULTURE, BLOOD (ROUTINE X 2)
Culture: NO GROWTH
Culture: NO GROWTH
Special Requests: ADEQUATE
Special Requests: ADEQUATE

## 2020-04-16 ENCOUNTER — Ambulatory Visit (INDEPENDENT_AMBULATORY_CARE_PROVIDER_SITE_OTHER): Payer: Medicare Other | Admitting: Internal Medicine

## 2020-04-16 ENCOUNTER — Other Ambulatory Visit: Payer: Self-pay

## 2020-04-16 DIAGNOSIS — J1282 Pneumonia due to coronavirus disease 2019: Secondary | ICD-10-CM | POA: Diagnosis not present

## 2020-04-16 DIAGNOSIS — U071 COVID-19: Secondary | ICD-10-CM

## 2020-04-16 NOTE — Assessment & Plan Note (Addendum)
(  Televisit) Patient has been on Metformin 1000 mg twice daily and Invokana 300 mg daily. She does not check her blood sugar at home and does not know how it was while she was on Dexamethazone.(She finished last dose of Dexamethazon today)  -Will continue current regimen -Recommended to come in person to follow up with PCP for DM f/u and HbA1c in 2 months

## 2020-04-16 NOTE — Assessment & Plan Note (Addendum)
(  Televisit)  This is a post hospital follow up in [facility]. Patient was hospitalized 2/5-04/07/2020 for COVID-19 pneumonia. She received remdesivir and dexamethasone. She was discharged home with supplemental oxygen for ambulation and to continue p.o. dexamethasone for 9 more days.  Today, she states that she is much better. Her energy is back. She finished dexamethasone course (Last day was today). She does not have any further chest pain and her cough resolved. She expresses that she does not really need Oxygen supplement now and only uses that occassionally with ambulation at home.   -Can taper down O2 supplement (Recommended her to buy a pulse ox device. O2 sat goal >92% at room air) -F/u in clinic as needed

## 2020-04-16 NOTE — Progress Notes (Signed)
  [facility] Internal Medicine Residency Telephone Encounter Continuity Care Appointment  HPI:   This telephone encounter was created for Ms. Ruth Blackburn on 04/16/2020 for the following purpose/cc hospital follow up. Please refer to problem based charting for further details and assessment and plan.   PMHx: Diabetes, hypertension, and hyperlipidemia, recent COVID-19 infection   Past Medical History:  Past Medical History:  Diagnosis Date  . Arthritis   . Diabetes ([place])   . Hypertension   . Neuropathy      ROS:   No fever or chills. No SOB, no cough, no chest pain.   Assessment / Plan / Recommendations:   Please see A&P under problem oriented charting for assessment of the patient's acute and chronic medical conditions.   As always, pt is advised that if symptoms worsen or new symptoms arise, they should go to an urgent care facility or to to ER for further evaluation.   Consent and Medical Decision Making:   Patient discussed with Dr. Evette Doffing  This is a telephone encounter between Ruth Blackburn and Malorie Bigford on 04/16/2020 for hospital follow up. The visit was conducted with the patient located at home and [facility]. The patient's identity was confirmed using their DOB and current address. The patient has consented to being evaluated through a telephone encounter and understands the associated risks (an examination cannot be done and the patient may need to come in for an appointment) / benefits (allows the patient to remain at home, decreasing exposure to coronavirus). I personally spent 10 minutes on medical discussion.

## 2020-04-17 NOTE — Progress Notes (Signed)
Internal Medicine Clinic Attending  Case discussed with Dr. Masoudi  At the time of the visit.  We reviewed the resident's history and exam and pertinent patient test results.  I agree with the assessment, diagnosis, and plan of care documented in the resident's note.  

## 2020-05-01 ENCOUNTER — Other Ambulatory Visit: Payer: Self-pay | Admitting: *Deleted

## 2020-05-01 DIAGNOSIS — I10 Essential (primary) hypertension: Secondary | ICD-10-CM

## 2020-05-01 MED ORDER — AMLODIPINE BESYLATE 10 MG PO TABS: 10.0000 mg | ORAL_TABLET | Freq: Every day | ORAL | 3 refills | Status: DC

## 2020-05-07 ENCOUNTER — Other Ambulatory Visit: Payer: Self-pay | Admitting: *Deleted

## 2020-05-07 DIAGNOSIS — I10 Essential (primary) hypertension: Secondary | ICD-10-CM

## 2020-05-07 MED ORDER — METOPROLOL SUCCINATE ER 100 MG PO TB24
100.0000 mg | ORAL_TABLET | Freq: Every day | ORAL | 3 refills | Status: DC
Start: 2020-05-07 — End: 2021-09-04

## 2020-05-23 ENCOUNTER — Telehealth: Payer: Self-pay | Admitting: Dietician

## 2020-05-23 NOTE — Telephone Encounter (Signed)
Patient reports her eye doctor is Dr. Zenia Resides office. Called for reports. She is due for a dilated diabetes eye exam.

## 2020-07-24 ENCOUNTER — Other Ambulatory Visit: Payer: Self-pay

## 2020-07-24 ENCOUNTER — Ambulatory Visit (INDEPENDENT_AMBULATORY_CARE_PROVIDER_SITE_OTHER): Payer: Medicare Other | Admitting: Internal Medicine

## 2020-07-24 ENCOUNTER — Encounter: Payer: Self-pay | Admitting: Internal Medicine

## 2020-07-24 VITALS — BP 181/86

## 2020-07-24 DIAGNOSIS — Z1211 Encounter for screening for malignant neoplasm of colon: Secondary | ICD-10-CM

## 2020-07-24 DIAGNOSIS — Z1159 Encounter for screening for other viral diseases: Secondary | ICD-10-CM | POA: Diagnosis not present

## 2020-07-24 DIAGNOSIS — R195 Other fecal abnormalities: Secondary | ICD-10-CM | POA: Diagnosis not present

## 2020-07-24 DIAGNOSIS — E7800 Pure hypercholesterolemia, unspecified: Secondary | ICD-10-CM

## 2020-07-24 DIAGNOSIS — N6320 Unspecified lump in the left breast, unspecified quadrant: Secondary | ICD-10-CM

## 2020-07-24 DIAGNOSIS — I10 Essential (primary) hypertension: Secondary | ICD-10-CM | POA: Diagnosis not present

## 2020-07-24 DIAGNOSIS — M722 Plantar fascial fibromatosis: Secondary | ICD-10-CM | POA: Diagnosis not present

## 2020-07-24 DIAGNOSIS — E1142 Type 2 diabetes mellitus with diabetic polyneuropathy: Secondary | ICD-10-CM

## 2020-07-24 LAB — POCT GLYCOSYLATED HEMOGLOBIN (HGB A1C): Hemoglobin A1C: 8.3 % — AB (ref 4.0–5.6)

## 2020-07-24 LAB — GLUCOSE, CAPILLARY: Glucose-Capillary: 144 mg/dL — ABNORMAL HIGH (ref 70–99)

## 2020-07-24 NOTE — Assessment & Plan Note (Signed)
BP Readings from Last 3 Encounters:  07/24/20 (!) 181/86  04/07/20 (!) 143/87  02/27/20 130/73   Ms. Marken blood pressure today is markedly above goal.  She states that she is taking her amlodipine, losartan and metoprolol without any difficulty.  She notes that she recently obtained a blood pressure cuff and her blood pressure at home is on average around 130.  Due to this she is very hesitant to change her medications at this time.  Assessment/plan: - Continue with current medication management, amlodipine 10 mg, losartan 25 mg, metoprolol 100 mg - Instructed patient to call within the next 1 to 2 weeks with her home blood pressure readings - If remains elevated, consider increasing her losartan.

## 2020-07-24 NOTE — Assessment & Plan Note (Signed)
Ruth Blackburn states that few days ago she noticed a small left breast lump that was tender to palpation.  Since then, the tenderness has gone away and she feels that the mass is getting smaller as well.  She notes a history of breast cysts in the past that has affected the same breast.  She denies any fever, chills, weight loss.  No family history or personal history of breast cancer  Assessment/plan: Breast examination demonstrates a small 1 cm circular mass that is very superficial, most consistent with a cyst or even a small boil that has not opened.    - Recommended continued monitoring over the next 5 to 7 days and if no resolution, to obtain a soft tissue ultrasound.  Patient is in agreement.

## 2020-07-24 NOTE — Patient Instructions (Addendum)
It was nice seeing you today! Thank you for choosing [facility] for your Primary Care.    Today we talked about:   1. Left Breast Mass: Given how small the mass is, and that it is improving, I agree that this is likely a cyst. If it doesn't go away in the next 5-7 days, call our clinic so we can go ahead and order an ultrasound.   2. Plantar Fascitis: Try the water bottle trick tonight.   3. High blood pressure: Continue with Amlodipine, Losartan and Metoprolol   4. Diabetes: Continue Invokana and Metformin.   5. I will check your cholesterol today and screen for Hepatitis C.

## 2020-07-24 NOTE — Assessment & Plan Note (Signed)
Patient is currently on atorvastatin 20 mg daily without any difficulty.  She is requesting her lipid panel be evaluated today.  Assessment/plan: - Lipid panel pending

## 2020-07-24 NOTE — Progress Notes (Signed)
   CC: Breast lump  HPI:  Ruth Blackburn is a 60 y.o. with a PMHx as listed below who presents to the clinic for breast lump.   Please see the Encounters tab for problem-based Assessment & Plan regarding status of patient's acute and chronic conditions.  Past Medical History:  Diagnosis Date  . Arthritis   . Diabetes ([place])   . Hypertension   . Neuropathy    Review of Systems: Review of Systems  Constitutional: Negative for chills and fever.  Respiratory: Negative for shortness of breath and wheezing.   Cardiovascular: Negative for chest pain and palpitations.  Skin:       + Left breast lump   Physical Exam:  Vitals:   07/24/20 1427  BP: (!) 181/86   Physical Exam Vitals and nursing note reviewed.  Constitutional:      General: She is not in acute distress.    Appearance: She is obese.  Skin:    General: Skin is warm and dry.     Comments:  Right breast exam without any abnormalities, including masses.  Overlying skin within normal limits.  Left breast exam with evidence of approximately 1 cm circular superficial mass that is non-tender, non-erythematous, most consistent with a cyst. No overlying opening to the skin.   Neurological:     General: No focal deficit present.     Mental Status: She is alert and oriented to person, place, and time. Mental status is at baseline.  Psychiatric:        Mood and Affect: Mood normal.        Behavior: Behavior normal.        Thought Content: Thought content normal.        Judgment: Judgment normal.     Assessment & Plan:   See Encounters Tab for problem based charting.  Patient discussed with Dr. Heber [place]

## 2020-07-24 NOTE — Assessment & Plan Note (Signed)
Lab Results  Component Value Date   HGBA1C 8.3 (A) 07/24/2020   Ruth Blackburn states that she is taking her Invokana and metformin daily without any difficulty.  She is requesting that her A1c be checked today.  Assessment/Plan: A1c increased from last check in December 2021. No medication changes at this time, but recommend lifestyle adjust, including diet and weight loss.

## 2020-07-24 NOTE — Assessment & Plan Note (Signed)
Patient states she has a longtime history of plantar fasciitis.  She is seeing podiatry tomorrow but is having a lot of pain in her bilateral heels today.  Assessment/plan: - Recommended conservative management with rolling her feet over a tennis ball or frozen water bottle

## 2020-07-25 ENCOUNTER — Other Ambulatory Visit: Payer: Medicare Other

## 2020-07-25 ENCOUNTER — Ambulatory Visit (INDEPENDENT_AMBULATORY_CARE_PROVIDER_SITE_OTHER): Payer: Medicare Other

## 2020-07-25 ENCOUNTER — Other Ambulatory Visit: Payer: Self-pay

## 2020-07-25 ENCOUNTER — Ambulatory Visit (INDEPENDENT_AMBULATORY_CARE_PROVIDER_SITE_OTHER): Payer: Medicare Other | Admitting: Podiatry

## 2020-07-25 DIAGNOSIS — M79671 Pain in right foot: Secondary | ICD-10-CM | POA: Diagnosis not present

## 2020-07-25 DIAGNOSIS — G8929 Other chronic pain: Secondary | ICD-10-CM

## 2020-07-25 DIAGNOSIS — M722 Plantar fascial fibromatosis: Secondary | ICD-10-CM

## 2020-07-25 DIAGNOSIS — Z1211 Encounter for screening for malignant neoplasm of colon: Secondary | ICD-10-CM

## 2020-07-25 LAB — LIPID PANEL
Chol/HDL Ratio: 2.9 ratio (ref 0.0–4.4)
Cholesterol, Total: 204 mg/dL — ABNORMAL HIGH (ref 100–199)
HDL: 70 mg/dL (ref >39)
LDL Chol Calc (NIH): 106 mg/dL — ABNORMAL HIGH (ref 0–99)
Triglycerides: 162 mg/dL — ABNORMAL HIGH (ref 0–149)
VLDL Cholesterol Cal: 28 mg/dL (ref 5–40)

## 2020-07-25 LAB — HEPATITIS C ANTIBODY: Hep C Virus Ab: <0.1 s/co ratio (ref 0.0–0.9)

## 2020-07-25 MED ORDER — TRIAMCINOLONE ACETONIDE 10 MG/ML IJ SUSP
10.0000 mg | Freq: Once | INTRAMUSCULAR | Status: AC
  Administered 2020-07-25: 10 mg

## 2020-07-25 MED ORDER — MELOXICAM 7.5 MG PO TABS: 7.5000 mg | ORAL_TABLET | Freq: Every day | ORAL | 0 refills | Status: DC

## 2020-07-25 NOTE — Patient Instructions (Signed)
For instructions on how to put on your Plantar Fascial Brace, please visit [email or web address]   Plantar Fasciitis (Heel Spur Syndrome) with Rehab The plantar fascia is a fibrous, ligament-like, soft-tissue structure that spans the bottom of the foot. Plantar fasciitis is a condition that causes pain in the foot due to inflammation of the tissue. SYMPTOMS   Pain and tenderness on the underneath side of the foot.  Pain that worsens with standing or walking. CAUSES  Plantar fasciitis is caused by irritation and injury to the plantar fascia on the underneath side of the foot. Common mechanisms of injury include:  Direct trauma to bottom of the foot.  Damage to a small nerve that runs under the foot where the main fascia attaches to the heel bone.  Stress placed on the plantar fascia due to bone spurs. RISK INCREASES WITH:   Activities that place stress on the plantar fascia (running, jumping, pivoting, or cutting).  Poor strength and flexibility.  Improperly fitted shoes.  Tight calf muscles.  Flat feet.  Failure to warm-up properly before activity.  Obesity. PREVENTION  Warm up and stretch properly before activity.  Allow for adequate recovery between workouts.  Maintain physical fitness:  Strength, flexibility, and endurance.  Cardiovascular fitness.  Maintain a health body weight.  Avoid stress on the plantar fascia.  Wear properly fitted shoes, including arch supports for individuals who have flat feet.  PROGNOSIS  If treated properly, then the symptoms of plantar fasciitis usually resolve without surgery. However, occasionally surgery is necessary.  RELATED COMPLICATIONS   Recurrent symptoms that may result in a chronic condition.  Problems of the lower back that are caused by compensating for the injury, such as limping.  Pain or weakness of the foot during push-off following surgery.  Chronic inflammation, scarring, and partial or complete  fascia tear, occurring more often from repeated injections.  TREATMENT  Treatment initially involves the use of ice and medication to help reduce pain and inflammation. The use of strengthening and stretching exercises may help reduce pain with activity, especially stretches of the Achilles tendon. These exercises may be performed at home or with a therapist. Your caregiver may recommend that you use heel cups of arch supports to help reduce stress on the plantar fascia. Occasionally, corticosteroid injections are given to reduce inflammation. If symptoms persist for greater than 6 months despite non-surgical (conservative), then surgery may be recommended.   MEDICATION   If pain medication is necessary, then nonsteroidal anti-inflammatory medications, such as aspirin and ibuprofen, or other minor pain relievers, such as acetaminophen, are often recommended.  Do not take pain medication within 7 days before surgery.  Prescription pain relievers may be given if deemed necessary by your caregiver. Use only as directed and only as much as you need.  Corticosteroid injections may be given by your caregiver. These injections should be reserved for the most serious cases, because they may only be given a certain number of times.  HEAT AND COLD  Cold treatment (icing) relieves pain and reduces inflammation. Cold treatment should be applied for 10 to 15 minutes every 2 to 3 hours for inflammation and pain and immediately after any activity that aggravates your symptoms. Use ice packs or massage the area with a piece of ice (ice massage).  Heat treatment may be used prior to performing the stretching and strengthening activities prescribed by your caregiver, physical therapist, or athletic trainer. Use a heat pack or soak the injury in warm water.  SEEK IMMEDIATE MEDICAL   CARE IF:  Treatment seems to offer no benefit, or the condition worsens.  Any medications produce adverse side effects.   EXERCISES- RANGE OF MOTION (ROM) AND STRETCHING EXERCISES - Plantar Fasciitis (Heel Spur Syndrome) These exercises may help you when beginning to rehabilitate your injury. Your symptoms may resolve with or without further involvement from your physician, physical therapist or athletic trainer. While completing these exercises, remember:   Restoring tissue flexibility helps normal motion to return to the joints. This allows healthier, less painful movement and activity.  An effective stretch should be held for at least 30 seconds.  A stretch should never be painful. You should only feel a gentle lengthening or release in the stretched tissue.  RANGE OF MOTION - Toe Extension, Flexion  Sit with your right / left leg crossed over your opposite knee.  Grasp your toes and gently pull them back toward the top of your foot. You should feel a stretch on the bottom of your toes and/or foot.  Hold this stretch for 10 seconds.  Now, gently pull your toes toward the bottom of your foot. You should feel a stretch on the top of your toes and or foot.  Hold this stretch for 10 seconds. Repeat  times. Complete this stretch 3 times per day.   RANGE OF MOTION - Ankle Dorsiflexion, Active Assisted  Remove shoes and sit on a chair that is preferably not on a carpeted surface.  Place right / left foot under knee. Extend your opposite leg for support.  Keeping your heel down, slide your right / left foot back toward the chair until you feel a stretch at your ankle or calf. If you do not feel a stretch, slide your bottom forward to the edge of the chair, while still keeping your heel down.  Hold this stretch for 10 seconds. Repeat 3 times. Complete this stretch 2 times per day.   STRETCH  Gastroc, Standing  Place hands on wall.  Extend right / left leg, keeping the front knee somewhat bent.  Slightly point your toes inward on your back foot.  Keeping your right / left heel on the floor and your  knee straight, shift your weight toward the wall, not allowing your back to arch.  You should feel a gentle stretch in the right / left calf. Hold this position for 10 seconds. Repeat 3 times. Complete this stretch 2 times per day.  STRETCH  Soleus, Standing  Place hands on wall.  Extend right / left leg, keeping the other knee somewhat bent.  Slightly point your toes inward on your back foot.  Keep your right / left heel on the floor, bend your back knee, and slightly shift your weight over the back leg so that you feel a gentle stretch deep in your back calf.  Hold this position for 10 seconds. Repeat 3 times. Complete this stretch 2 times per day.  STRETCH  Gastrocsoleus, Standing  Note: This exercise can place a lot of stress on your foot and ankle. Please complete this exercise only if specifically instructed by your caregiver.   Place the ball of your right / left foot on a step, keeping your other foot firmly on the same step.  Hold on to the wall or a rail for balance.  Slowly lift your other foot, allowing your body weight to press your heel down over the edge of the step.  You should feel a stretch in your right / left calf.  Hold this   position for 10 seconds.  Repeat this exercise with a slight bend in your right / left knee. Repeat 3 times. Complete this stretch 2 times per day.   STRENGTHENING EXERCISES - Plantar Fasciitis (Heel Spur Syndrome)  These exercises may help you when beginning to rehabilitate your injury. They may resolve your symptoms with or without further involvement from your physician, physical therapist or athletic trainer. While completing these exercises, remember:   Muscles can gain both the endurance and the strength needed for everyday activities through controlled exercises.  Complete these exercises as instructed by your physician, physical therapist or athletic trainer. Progress the resistance and repetitions only as guided.  STRENGTH -  Towel Curls  Sit in a chair positioned on a non-carpeted surface.  Place your foot on a towel, keeping your heel on the floor.  Pull the towel toward your heel by only curling your toes. Keep your heel on the floor. Repeat 3 times. Complete this exercise 2 times per day.  STRENGTH - Ankle Inversion  Secure one end of a rubber exercise band/tubing to a fixed object (table, pole). Loop the other end around your foot just before your toes.  Place your fists between your knees. This will focus your strengthening at your ankle.  Slowly, pull your big toe up and in, making sure the band/tubing is positioned to resist the entire motion.  Hold this position for 10 seconds.  Have your muscles resist the band/tubing as it slowly pulls your foot back to the starting position. Repeat 3 times. Complete this exercises 2 times per day.  Document Released: 02/16/2005 Document Revised: 05/11/2011 Document Reviewed: 05/31/2008 [employer] Patient Information 2014 [employer]. 

## 2020-07-26 LAB — FECAL OCCULT BLOOD, IMMUNOCHEMICAL: Fecal Occult Bld: POSITIVE — AB

## 2020-07-26 NOTE — Progress Notes (Signed)
Results reviewed.  LDL has increased from 88-1 06.  Patient is already on statin therapy.  Hep C antibody negative, no need for further screening at this time.  These results were reviewed with Ruth Blackburn and she expressed understanding.  I encouraged that she make an appointment with her primary care to discuss if medications need to be adjusted as well as to discuss her blood pressure.

## 2020-07-26 NOTE — Progress Notes (Signed)
Internal Medicine Clinic Attending  Case discussed with Dr. Basaraba  At the time of the visit.  We reviewed the resident's history and exam and pertinent patient test results.  I agree with the assessment, diagnosis, and plan of care documented in the resident's note.  

## 2020-07-28 NOTE — Progress Notes (Signed)
Subjective: 60 year old female presents the office today for concerns of right heel pain which is reoccurring.  She denies any recent injury.  She has been getting worse over the last couple of months.  No swelling.  She has been to the instruction exercises some at home but no other treatment.  No other concerns.  Of note I last saw her October 2021 for plantar fasciitis and she seemed to be doing well.  She states has been coming on and getting worse since then. Denies any systemic complaints such as fevers, chills, nausea, vomiting. No acute changes since last appointment, and no other complaints at this time.   Objective: AAO x3, NAD DP/PT pulses palpable bilaterally, CRT less than 3 seconds Tenderness to palpation along the plantar medial tubercle of the calcaneus at the insertion of plantar fascia on the right foot. There is no pain along the course of the plantar fascia within the arch of the foot. Plantar fascia appears to be intact. There is no pain with lateral compression of the calcaneus or pain with vibratory sensation. There is no pain along the course or insertion of the achilles tendon. No other areas of tenderness to bilateral lower extremities. No pain with calf compression, swelling, warmth, erythema  Assessment: Right heel pain, plantar fasciitis  Plan: -All treatment options discussed with the patient including all alternatives, risks, complications.  -Steroid injection performed.  See procedure note below.  Continue stretching, icing daily.  Plantar fascial brace dispensed.  Discussed shoe modifications and orthotics. -Patient encouraged to call the office with any questions, concerns, change in symptoms.   Procedure: Injection Tendon/Ligament Discussed alternatives, risks, complications and verbal consent was obtained.  Location: Right plantar fascia at the glabrous junction; medial approach. Skin Prep: Alcohol. Injectate: 0.5cc 0.5% marcaine plain, 0.5 cc 2% lidocaine  plain and, 1 cc kenalog 10. Disposition: Patient tolerated procedure well. Injection site dressed with a band-aid.  Post-injection care was discussed and return precautions discussed.    Trula Slade DPM

## 2020-07-30 ENCOUNTER — Other Ambulatory Visit: Payer: Self-pay | Admitting: Internal Medicine

## 2020-07-30 DIAGNOSIS — R195 Other fecal abnormalities: Secondary | ICD-10-CM | POA: Insufficient documentation

## 2020-07-30 NOTE — Addendum Note (Signed)
Addended by: Jose Persia on: 07/30/2020 03:55 PM   Modules accepted: Orders

## 2020-07-30 NOTE — Assessment & Plan Note (Signed)
ADDENDUM:  FIT test positive. Discussed with patient and in agreement for referral for colonoscopy. Order placed.

## 2020-08-09 ENCOUNTER — Encounter: Payer: Self-pay | Admitting: Gastroenterology

## 2020-09-19 ENCOUNTER — Ambulatory Visit (INDEPENDENT_AMBULATORY_CARE_PROVIDER_SITE_OTHER): Payer: Medicare Other | Admitting: Gastroenterology

## 2020-09-19 ENCOUNTER — Encounter: Payer: Self-pay | Admitting: Gastroenterology

## 2020-09-19 VITALS — BP 138/88 | HR 73 | Ht 65.0 in | Wt 312.0 lb

## 2020-09-19 DIAGNOSIS — R195 Other fecal abnormalities: Secondary | ICD-10-CM | POA: Diagnosis not present

## 2020-09-19 NOTE — Patient Instructions (Addendum)
It was my pleasure to provide care to you today. Based on our discussion, I am providing you with my recommendations below:  RECOMMENDATION(S):   You have been scheduled for a colonoscopy. Please follow written instructions given to you at your visit today.   PREP:   Please pick up your prep supplies at the pharmacy within the next 1-3 days.  INHALERS:   If you use inhalers (even only as needed), please bring them with you on the day of your procedure.  MEDICATIONS TO HOLD:  Please refer to your Prep Instructions regarding holding your diabetic medications.   COLONOSCOPY TIPS:  To reduce nausea and dehydration, stay well hydrated for 3-4 days prior to the exam.  To prevent skin/hemorrhoid irritation - prior to wiping, put A&Dointment or vaseline on the toilet paper. Keep a towel or pad on the bed.  BEFORE STARTING YOUR PREP, drink  64oz of clear liquids in the morning. This will help to flush the colon and will ensure you are well hydrated!!!!  NOTE - This is in addition to the fluids required for to complete your prep. Use of a flavored hard candy, such as grape Anise Salvo, can counteract some of the flavor of the prep and may prevent some nausea.   FOLLOW UP:  After your procedure, you will receive a call from my office staff regarding my recommendation for follow up.  BMI:  If you are age 60 or younger, your body mass index should be between 19-25. Your There is no height or weight on file to calculate BMI. If this is out of the aformentioned range listed, please consider follow up with your Primary Care Provider.   MY CHART:  The [facility] providers would like to encourage you to use [facility] to communicate with providers for non-urgent requests or questions.  Due to long hold times on the telephone, sending your provider a message by [facility] may be a faster and more efficient way to get a response.  Please allow 48 business hours for a response.  Please remember that this  is for non-urgent requests.   Thank you for trusting me with your gastrointestinal care!    Thornton Park, MD, MPH

## 2020-09-19 NOTE — Progress Notes (Addendum)
Referring Provider: Lucious Groves, DO Primary Care Physician:  Riesa Pope, MD  Reason for Consultation:  FIT +   IMPRESSION:  FIT+ without overt bleeding BMI 75 Family history of colon polyps (sister)  PLAN: Colonoscopy at the hospital  I consented the patient today discussing the risks, benefits, and alternatives to endoscopic evaluation. In particular, we discussed the risks that include, but are not limited to, reaction to medication, cardiopulmonary compromise, bleeding requiring blood transfusion, aspiration resulting in pneumonia, perforation requiring surgery, lack of diagnosis, severe illness requiring hospitalization, and even death. We reviewed the risk of missed lesion including polyps or even cancer. The patient acknowledges these risks and asks that we proceed.   Please see the "Patient Instructions" section for addition details about the plan.   HPI: Ruth Blackburn is a 60 y.o. female referred by Dr. He[place] for a positive FIT. The history is obtined through the patient and review of her electronic health record. She has hypertension, arthritis, diabetes, and neuropathy. Retired/disabled due to arthritis.   FIT + 09/19/20 Ferritin 100, hemoglobin 14, platelets 329 04/07/20  No prior endoscopy or colon cancer screening.  No symptoms associated with the + FIT.  No overt GI blood loss. No melena, hematochezia, bright red blood per rectum. No epistaxis, vaginal bleeding, hemoptysis, or hematuria.    Sister with colon polyps. No other known family history of colon cancer or polyps. No family history of uterine/endometrial cancer, pancreatic cancer or gastric/stomach cancer.   Past Medical History:  Diagnosis Date   Arthritis    COVID-19 04/2020   Diabetes [facility])    Hypertension    Neuropathy    Obesity     Past Surgical History:  Procedure Laterality Date   ABDOMINAL HYSTERECTOMY  2006   BREAST REDUCTION SURGERY  2004   REDUCTION MAMMAPLASTY  Bilateral 2004    Current Outpatient Medications  Medication Sig Dispense Refill   amLODipine (NORVASC) 10 MG tablet Take 1 tablet (10 mg total) by mouth daily. 90 tablet 3   atorvastatin (LIPITOR) 20 MG tablet Take 1 tablet (20 mg total) by mouth daily. 30 tablet 11   canagliflozin (INVOKANA) 300 MG TABS tablet Take 1 tablet (300 mg total) by mouth daily before breakfast. 30 tablet 9   losartan (COZAAR) 25 MG tablet Take 1 tablet (25 mg total) by mouth daily. 90 tablet 3   metFORMIN (GLUCOPHAGE) 1000 MG tablet Take 1 tablet (1,000 mg total) by mouth 2 (two) times daily with a meal. 180 tablet 3   metoprolol succinate (TOPROL-XL) 100 MG 24 hr tablet Take 1 tablet (100 mg total) by mouth daily. Take with or immediately following a meal. 90 tablet 3   traZODone (DESYREL) 100 MG tablet Take 1 tablet (100 mg total) by mouth at bedtime. Break in half for first week, if still unable to sleep start taking whole pill. (Patient taking differently: Take 100 mg by mouth at bedtime as needed. Break in half for first week, if still unable to sleep start taking whole pill.) 30 tablet 3   No current facility-administered medications for this visit.    Allergies as of 09/19/2020   (No Known Allergies)    Family History  Problem Relation Age of Onset   Diabetes Mother    Heart failure Mother    CAD Mother    Kidney disease Mother        on dialysis   Hypertension Mother    Diabetes Sister    Colon cancer Neg Hx  Stomach cancer Neg Hx     Social History   Socioeconomic History   Marital status: Legally Separated    Spouse name: Not on file   Number of children: Not on file   Years of education: Not on file   Highest education level: Not on file  Occupational History   Not on file  Tobacco Use   Smoking status: Former    Types: Cigarettes    Quit date: 03/02/2008    Years since quitting: 12.5   Smokeless tobacco: Never  Vaping Use   Vaping Use: Never used  Substance and Sexual  Activity   Alcohol use: Not Currently   Drug use: Never   Sexual activity: Not on file  Other Topics Concern   Not on file  Social History Narrative   Not on file   Social Determinants of Health   Financial Resource Strain: Not on file  Food Insecurity: Not on file  Transportation Needs: Not on file  Physical Activity: Not on file  Stress: Not on file  Social Connections: Not on file  Intimate Partner Violence: Not on file    Review of Systems: 12 system ROS is negative except as noted above.   Physical Exam: General:   Alert,  well-nourished, pleasant and cooperative in NAD Head:  Normocephalic and atraumatic. Eyes:  Sclera clear, no icterus.   Conjunctiva pink. Ears:  Normal auditory acuity. Nose:  No deformity, discharge,  or lesions. Mouth:  No deformity or lesions.   Neck:  Supple; no masses or thyromegaly. Lungs:  Clear throughout to auscultation.   No wheezes. Heart:  Regular rate and rhythm; no murmurs. Abdomen:  Soft,nontender, nondistended, normal bowel sounds, no rebound or guarding. No hepatosplenomegaly.   Rectal:  Deferred  Msk:  Symmetrical. No boney deformities LAD: No inguinal or umbilical LAD Extremities:  No clubbing or edema. Neurologic:  Alert and  oriented x4;  grossly nonfocal Skin:  Intact without significant lesions or rashes. Psych:  Alert and cooperative. Normal mood and affect.     Jessejames Steelman L. Tarri Glenn, MD, MPH 09/19/2020, 10:20 AM

## 2020-09-26 ENCOUNTER — Other Ambulatory Visit: Payer: Self-pay | Admitting: Internal Medicine

## 2020-09-26 DIAGNOSIS — F5101 Primary insomnia: Secondary | ICD-10-CM

## 2020-09-27 NOTE — Telephone Encounter (Signed)
Patient will need to be evaluated prior to refilling this medication, also I believe she is due for a return to clinic for assessment of her chronic medical conditions (HTN, DM, HLD). Can we please schedule her an appointment? Will be denying the refill until then, thank you.

## 2020-09-28 ENCOUNTER — Telehealth: Payer: Self-pay | Admitting: Internal Medicine

## 2020-09-28 DIAGNOSIS — F5101 Primary insomnia: Secondary | ICD-10-CM

## 2020-09-30 ENCOUNTER — Encounter: Payer: Self-pay | Admitting: Student

## 2020-09-30 NOTE — Telephone Encounter (Signed)
Attempted to contact patient no answer and voicemail is not set up.  Will send letter asking patient to give clinic a call back to schedule future appointment.

## 2020-10-10 ENCOUNTER — Other Ambulatory Visit: Payer: Self-pay | Admitting: *Deleted

## 2020-10-10 DIAGNOSIS — I10 Essential (primary) hypertension: Secondary | ICD-10-CM

## 2020-10-10 MED ORDER — LOSARTAN POTASSIUM 25 MG PO TABS: 25.0000 mg | ORAL_TABLET | Freq: Every day | ORAL | 3 refills | Status: DC

## 2020-10-18 ENCOUNTER — Encounter (HOSPITAL_COMMUNITY): Payer: Self-pay | Admitting: Gastroenterology

## 2020-10-18 ENCOUNTER — Other Ambulatory Visit: Payer: Self-pay

## 2020-10-23 ENCOUNTER — Encounter (HOSPITAL_COMMUNITY): Payer: Self-pay | Admitting: Gastroenterology

## 2020-10-23 NOTE — Anesthesia Preprocedure Evaluation (Addendum)
Anesthesia Evaluation  Patient identified by MRN, date of birth, ID band Patient awake    Reviewed: Allergy & Precautions, NPO status , Patient's Chart, lab work & pertinent test results  Airway Mallampati: II  TM Distance: >3 FB Neck ROM: Full    Dental no notable dental hx. (+) Teeth Intact, Dental Advisory Given   Pulmonary former smoker,    Pulmonary exam normal breath sounds clear to auscultation       Cardiovascular hypertension, Normal cardiovascular exam Rhythm:Regular Rate:Normal     Neuro/Psych negative neurological ROS     GI/Hepatic negative GI ROS, Neg liver ROS,   Endo/Other  diabetes  Renal/GU      Musculoskeletal  (+) Arthritis ,   Abdominal (+) + obese (bmi 49.2),   Peds  Hematology   Anesthesia Other Findings   Reproductive/Obstetrics                           Anesthesia Physical Anesthesia Plan  ASA: 3  Anesthesia Plan: MAC   Post-op Pain Management:    Induction:   PONV Risk Score and Plan: Treatment may vary due to age or medical condition  Airway Management Planned: Nasal Cannula and Natural Airway  Additional Equipment: None  Intra-op Plan:   Post-operative Plan:   Informed Consent: I have reviewed the patients History and Physical, chart, labs and discussed the procedure including the risks, benefits and alternatives for the proposed anesthesia with the patient or authorized representative who has indicated his/her understanding and acceptance.     Dental advisory given  Plan Discussed with: CRNA  Anesthesia Plan Comments: (+ FIT for colonoscopy w Mac)       Anesthesia Quick Evaluation

## 2020-10-24 ENCOUNTER — Ambulatory Visit (HOSPITAL_COMMUNITY): Payer: Medicare Other | Admitting: Anesthesiology

## 2020-10-24 ENCOUNTER — Encounter (HOSPITAL_COMMUNITY)
Admission: RE | Disposition: A | Discharge status: Home / Self Care | Payer: Self-pay | Source: Home / Self Care | Attending: Gastroenterology

## 2020-10-24 ENCOUNTER — Other Ambulatory Visit: Payer: Self-pay

## 2020-10-24 ENCOUNTER — Encounter (HOSPITAL_COMMUNITY): Payer: Self-pay | Admitting: Gastroenterology

## 2020-10-24 ENCOUNTER — Ambulatory Visit (HOSPITAL_COMMUNITY)
Admission: RE | Admit: 2020-10-24 | Discharge: 2020-10-24 | Disposition: A | Discharge status: Home / Self Care | Payer: Medicare Other | Attending: Gastroenterology | Admitting: Gastroenterology

## 2020-10-24 DIAGNOSIS — K621 Rectal polyp: Secondary | ICD-10-CM | POA: Diagnosis not present

## 2020-10-24 DIAGNOSIS — K648 Other hemorrhoids: Secondary | ICD-10-CM | POA: Insufficient documentation

## 2020-10-24 DIAGNOSIS — Z8616 Personal history of COVID-19: Secondary | ICD-10-CM | POA: Insufficient documentation

## 2020-10-24 DIAGNOSIS — D123 Benign neoplasm of transverse colon: Secondary | ICD-10-CM | POA: Insufficient documentation

## 2020-10-24 DIAGNOSIS — Z87891 Personal history of nicotine dependence: Secondary | ICD-10-CM | POA: Diagnosis not present

## 2020-10-24 DIAGNOSIS — I10 Essential (primary) hypertension: Secondary | ICD-10-CM | POA: Diagnosis not present

## 2020-10-24 DIAGNOSIS — R195 Other fecal abnormalities: Secondary | ICD-10-CM

## 2020-10-24 DIAGNOSIS — Z8371 Family history of colonic polyps: Secondary | ICD-10-CM | POA: Insufficient documentation

## 2020-10-24 DIAGNOSIS — E1140 Type 2 diabetes mellitus with diabetic neuropathy, unspecified: Secondary | ICD-10-CM | POA: Diagnosis not present

## 2020-10-24 HISTORY — PX: POLYPECTOMY: SHX5525

## 2020-10-24 HISTORY — PX: COLONOSCOPY WITH PROPOFOL: SHX5780

## 2020-10-24 LAB — GLUCOSE, CAPILLARY: Glucose-Capillary: 210 mg/dL — ABNORMAL HIGH (ref 70–99)

## 2020-10-24 SURGERY — COLONOSCOPY WITH PROPOFOL
Anesthesia: Monitor Anesthesia Care

## 2020-10-24 MED ORDER — LIDOCAINE HCL (CARDIAC) PF 100 MG/5ML IV SOSY
PREFILLED_SYRINGE | INTRAVENOUS | Status: DC | PRN
  Administered 2020-10-24: 80 mg via INTRAVENOUS

## 2020-10-24 MED ORDER — LACTATED RINGERS IV SOLN: INTRAVENOUS | Status: DC | PRN

## 2020-10-24 MED ORDER — SODIUM CHLORIDE 0.9 % IV SOLN: INTRAVENOUS | Status: DC

## 2020-10-24 MED ORDER — PROPOFOL 500 MG/50ML IV EMUL
INTRAVENOUS | Status: DC | PRN
  Administered 2020-10-24: 50 mg via INTRAVENOUS

## 2020-10-24 MED ORDER — PROPOFOL 500 MG/50ML IV EMUL
INTRAVENOUS | Status: DC | PRN
  Administered 2020-10-24: 125 ug/kg/min via INTRAVENOUS

## 2020-10-24 MED ORDER — LACTATED RINGERS IV SOLN: Freq: Once | INTRAVENOUS | Status: AC

## 2020-10-24 MED ORDER — ONDANSETRON HCL 4 MG/2ML IJ SOLN
INTRAMUSCULAR | Status: DC | PRN
  Administered 2020-10-24: 4 mg via INTRAVENOUS

## 2020-10-24 SURGICAL SUPPLY — 21 items

## 2020-10-24 NOTE — H&P (Signed)
   Referring Provider: No ref. provider found Primary Care Physician:  Riesa Pope, MD  Reason for Procedure:  FIT +   IMPRESSION:  FIT+ without overt bleeding BMI 32 Family history of colon polyps (sister)  PLAN: Colonoscopy at the hospital  HPI: Ruth Blackburn is a 60 y.o. female who presents for colonoscopy for a positive FIT. She has hypertension, arthritis, diabetes, and neuropathy. Retired/disabled due to arthritis.   FIT + 09/19/20 Ferritin 100, hemoglobin 14, platelets 329 04/07/20  No prior endoscopy or colon cancer screening.  No symptoms associated with the + FIT.  No overt GI blood loss. No melena, hematochezia, bright red blood per rectum. No epistaxis, vaginal bleeding, hemoptysis, or hematuria.    Sister with colon polyps. No other known family history of colon cancer or polyps. No family history of uterine/endometrial cancer, pancreatic cancer or gastric/stomach cancer.   Past Medical History:  Diagnosis Date   Arthritis    COVID-19 04/2020   Diabetes [facility])    Hypertension    Neuropathy    Obesity     Past Surgical History:  Procedure Laterality Date   ABDOMINAL HYSTERECTOMY  2006   BREAST REDUCTION SURGERY  2004   REDUCTION MAMMAPLASTY Bilateral 2004    Current Facility-Administered Medications  Medication Dose Route Frequency Provider Last Rate Last Admin   0.9 %  sodium chloride infusion   Intravenous Continuous Thornton Park, MD        Allergies as of 09/19/2020   (No Known Allergies)    Family History  Problem Relation Age of Onset   Diabetes Mother    Heart failure Mother    CAD Mother    Kidney disease Mother        on dialysis   Hypertension Mother    Diabetes Sister    Colon cancer Neg Hx    Stomach cancer Neg Hx       Physical Exam: General:   Alert,  well-nourished, pleasant and cooperative in NAD Head:  Normocephalic and atraumatic. Eyes:  Sclera clear, no icterus.   Conjunctiva pink. Ears:  Normal  auditory acuity. Nose:  No deformity, discharge,  or lesions. Mouth:  No deformity or lesions.   Neck:  Supple; no masses or thyromegaly. Lungs:  Clear throughout to auscultation.   No wheezes. Heart:  Regular rate and rhythm; no murmurs. Abdomen:  Soft, nontender, nondistended, normal bowel sounds, no rebound or guarding. No hepatosplenomegaly.   Rectal:  Deferred  Msk:  Symmetrical. No boney deformities LAD: No inguinal or umbilical LAD Extremities:  No clubbing or edema. Neurologic:  Alert and  oriented x4;  grossly nonfocal Skin:  Intact without significant lesions or rashes. Psych:  Alert and cooperative. Normal mood and affect.     Brittanny Levenhagen L. Tarri Glenn, MD, MPH 10/24/2020, 8:21 AM

## 2020-10-24 NOTE — Anesthesia Postprocedure Evaluation (Signed)
Anesthesia Post Note  Patient: Onyx Edgley  Procedure(s) Performed: COLONOSCOPY WITH PROPOFOL POLYPECTOMY     Patient location during evaluation: Endoscopy Anesthesia Type: MAC Level of consciousness: awake and alert Pain management: pain level controlled Vital Signs Assessment: post-procedure vital signs reviewed and stable Respiratory status: spontaneous breathing, nonlabored ventilation, respiratory function stable and patient connected to nasal cannula oxygen Cardiovascular status: blood pressure returned to baseline and stable Postop Assessment: no apparent nausea or vomiting Anesthetic complications: no   No notable events documented.  Last Vitals:  Vitals:   10/24/20 1012 10/24/20 1020  BP: 121/68 138/83  Pulse: 86 85  Resp: (!) 22 (!) 21  Temp: (!) 35.9 C   SpO2: 100% 97%    Last Pain:  Vitals:   10/24/20 1012  TempSrc: Temporal  PainSc: 0-No pain                 Barnet Glasgow

## 2020-10-24 NOTE — Transfer of Care (Signed)
Immediate Anesthesia Transfer of Care Note  Patient: Ruth Blackburn  Procedure(s) Performed: Procedure(s): COLONOSCOPY WITH PROPOFOL (N/A) POLYPECTOMY  Patient Location: PACU  Anesthesia Type:MAC  Level of Consciousness:  sedated, patient cooperative and responds to stimulation  Airway & Oxygen Therapy:Patient Spontanous Breathing and Patient connected to face mask oxgen  Post-op Assessment:  Report given to PACU RN and Post -op Vital signs reviewed and stable  Post vital signs:  Reviewed and stable  Last Vitals:  Vitals:   10/24/20 0823 10/24/20 1012  BP: (!) 150/62 121/68  Pulse: 96 86  Resp: (!) 22 (!) 22  Temp: (!) 36 C (!) 35.9 C  SpO2: 92% 010%    Complications: No apparent anesthesia complications

## 2020-10-24 NOTE — Discharge Instructions (Signed)

## 2020-10-24 NOTE — Op Note (Signed)
[facility] Patient Name: Ruth Blackburn Procedure Date: 10/24/2020 MRN: [id number] Attending MD: Thornton Park MD, MD Date of Birth: 09-28-1960 CSN: [id number] Age: 60 Admit Type: Outpatient Procedure:                Colonoscopy Indications:              Positive fecal immunochemical test Providers:                Thornton Park MD, MD, Dulcy Fanny, Benetta Spar, Technician Referring MD:              Medicines:                Monitored Anesthesia Care Complications:            No immediate complications. Estimated blood loss:                            Minimal. Estimated Blood Loss:     Estimated blood loss was minimal. Procedure:                Pre-Anesthesia Assessment:                           - Prior to the procedure, a History and Physical                            was performed, and patient medications and                            allergies were reviewed. The patient's tolerance of                            previous anesthesia was also reviewed. The risks                            and benefits of the procedure and the sedation                            options and risks were discussed with the patient.                            All questions were answered, and informed consent                            was obtained. Prior Anticoagulants: The patient has                            taken no previous anticoagulant or antiplatelet                            agents. ASA Grade Assessment: III - A patient with                            severe systemic disease.  After reviewing the risks                            and benefits, the patient was deemed in                            satisfactory condition to undergo the procedure.                           After obtaining informed consent, the colonoscope                            was passed under direct vision. Throughout the                            procedure, the  patient's blood pressure, pulse, and                            oxygen saturations were monitored continuously. The                            [id number]) Olympus colonoscope was                            introduced through the anus and advanced to the the                            cecum, identified by appendiceal orifice and                            ileocecal valve. A second forward view of the right                            colon was performed. The colonoscopy was performed                            without difficulty. The patient tolerated the                            procedure well. The quality of the bowel                            preparation was good. The ileocecal valve,                            appendiceal orifice, and rectum were photographed. Scope In: 9:45:58 AM Scope Out: 10:06:35 AM Scope Withdrawal Time: 0 hours 19 minutes 31 seconds  Total Procedure Duration: 0 hours 20 minutes 37 seconds  Findings:      The perianal and digital rectal examinations were normal.      Non-bleeding internal hemorrhoids were found.      Two flat polyps were found in the rectum. The polyps were 1 to 2 mm in       size. These polyps were removed with a cold snare. Resection and  retrieval were complete. Estimated blood loss was minimal.      A 6 mm polyp was found in the distal transverse colon. The polyp was       sessile. The polyp was removed with a cold snare. Resection and       retrieval were complete. Estimated blood loss was minimal.      The exam was otherwise without abnormality on direct and retroflexion       views. Impression:               - Non-bleeding internal hemorrhoids.                           - Two 1 to 2 mm polyps in the rectum, removed with                            a cold snare. Resected and retrieved.                           - One 6 mm polyp in the distal transverse colon,                            removed with a cold snare. Resected and  retrieved.                           - The examination was otherwise normal on direct                            and retroflexion views. Moderate Sedation:      Not Applicable - Patient had care per Anesthesia. Recommendation:           - Patient has a contact number available for                            emergencies. The signs and symptoms of potential                            delayed complications were discussed with the                            patient. Return to normal activities tomorrow.                            Written discharge instructions were provided to the                            patient.                           - Resume previous diet.                           - Continue present medications.                           - Await pathology results.                           -  Repeat colonoscopy date to be determined after                            pending pathology results are reviewed for                            surveillance.                           - Emerging evidence supports eating a diet of                            fruits, vegetables, grains, calcium, and yogurt                            while reducing red meat and alcohol may reduce the                            risk of colon cancer.                           - Given these results, all first degree relatives                            (brothers, sisters, children, parents) should start                            colon cancer screening at age 68.                           - Thank you for allowing me to be involved in your                            colon cancer prevention. Procedure Code(s):        --- Professional ---                           [id number], Colonoscopy, flexible; with removal of                            tumor(s), polyp(s), or other lesion(s) by snare                            technique Diagnosis Code(s):        --- Professional ---                           K62.1, Rectal polyp                            K63.5, Polyp of colon                           K64.8, Other hemorrhoids  R19.5, Other fecal abnormalities CPT copyright 2019 American Medical Association. All rights reserved. The codes documented in this report are preliminary and upon coder review may  be revised to meet current compliance requirements. Thornton Park MD, MD 10/24/2020 10:15:58 AM This report has been signed electronically. Number of Addenda: 0

## 2020-10-25 ENCOUNTER — Encounter: Payer: Self-pay | Admitting: Gastroenterology

## 2020-10-25 ENCOUNTER — Encounter (HOSPITAL_COMMUNITY): Payer: Self-pay | Admitting: Gastroenterology

## 2020-10-25 LAB — SURGICAL PATHOLOGY

## 2020-11-01 ENCOUNTER — Telehealth: Payer: Self-pay

## 2020-11-01 NOTE — Telephone Encounter (Signed)
ERROR

## 2020-11-29 ENCOUNTER — Ambulatory Visit (INDEPENDENT_AMBULATORY_CARE_PROVIDER_SITE_OTHER): Payer: Medicare Other | Admitting: Internal Medicine

## 2020-11-29 VITALS — BP 125/72 | HR 77 | Temp 98.3°F | Ht 65.0 in | Wt 309.2 lb

## 2020-11-29 DIAGNOSIS — E1142 Type 2 diabetes mellitus with diabetic polyneuropathy: Secondary | ICD-10-CM

## 2020-11-29 DIAGNOSIS — R3915 Urgency of urination: Secondary | ICD-10-CM

## 2020-11-29 DIAGNOSIS — G8929 Other chronic pain: Secondary | ICD-10-CM | POA: Diagnosis not present

## 2020-11-29 DIAGNOSIS — K219 Gastro-esophageal reflux disease without esophagitis: Secondary | ICD-10-CM | POA: Diagnosis not present

## 2020-11-29 DIAGNOSIS — N6320 Unspecified lump in the left breast, unspecified quadrant: Secondary | ICD-10-CM | POA: Diagnosis not present

## 2020-11-29 LAB — GLUCOSE, CAPILLARY: Glucose-Capillary: 173 mg/dL — ABNORMAL HIGH (ref 70–99)

## 2020-11-29 LAB — POCT GLYCOSYLATED HEMOGLOBIN (HGB A1C): Hemoglobin A1C: 8.7 % — AB (ref 4.0–5.6)

## 2020-11-29 MED ORDER — PANTOPRAZOLE SODIUM 40 MG PO TBEC: 40.0000 mg | DELAYED_RELEASE_TABLET | Freq: Every day | ORAL | 1 refills | Status: DC

## 2020-11-29 MED ORDER — DULOXETINE HCL 30 MG PO CPEP: ORAL_CAPSULE | ORAL | 0 refills | Status: DC

## 2020-11-29 MED ORDER — DULOXETINE HCL 30 MG PO CPEP: ORAL_CAPSULE | ORAL | 1 refills | Status: DC

## 2020-11-29 NOTE — Addendum Note (Signed)
Addended by: Buddy Duty on: 11/29/2020 12:25 PM   Modules accepted: Orders

## 2020-11-29 NOTE — Assessment & Plan Note (Addendum)
The patient's shoulder pain is likely secondary to an old supraspinatus tendon tear. The patient also likely has arhtiritis in her back and lower extremities, which could be contributing to her pain, as well. She states that she uses a heating pad to get some relief, but this does not help much. She is not taking anything for the pain currently, and this often makes it difficult for her to sleep. She also has low back pain, bilateral knee pain and chronic left foot pain for which she follows with podiatry. She denies radiation of pain down the arms or legs or any numbness/tingling in her extremities. Patient has difficulty ambulating >100 ft at a time due to her pain that she experiences. She notes that a provider in the past had mentioned a referral to pain management, but this was never done.   Plan: - Handicap placard form provided to patient 2/2 limited mobility - Start duloxetine 30 mg daily x 2 weeks, followed by 60 mg thereafter (patient is not taking trazodone thus low suspicion for serotonin syndrome) - Follow up in ~6-8 weeks to assess pain  - Could consider referral to pain management in the future

## 2020-11-29 NOTE — Assessment & Plan Note (Signed)
Patient states that she has noticed an odd odor to her urine recently, despite any changes in hydration or diet. She states that she drinks a lot of water each day and her urine is light in color. She denies any dysuria, hematuria, increase in urinary frequency, or flank pain. The patient does endorse an increase in urinary urgency.   Plan: - Urinalysis performed today

## 2020-11-29 NOTE — Addendum Note (Signed)
Addended by: Lalla Brothers T on: 11/29/2020 02:33 PM   Modules accepted: Level of Service

## 2020-11-29 NOTE — Patient Instructions (Signed)
Thank you, Ms.Lanny Donoso for allowing [place] to provide your care today. Today we discussed: Left breast mass: Referral placed to the breast clinic so you can get an ultrasound to evaluate this Acid reflux: Start taking protonix 40 mg daily (before biggest meal). Also placed a referral to the gastroenterologist's office for you to possibly get an endoscopy performed Chronic pain: Start duloxetine 30 mg daily for 2 weeks. Then increase to 60 mg daily thereafter. Follow up in 1 month to reassess pain. Will consider a referral to pain management in the future Urine odor: testing urine today for bacterial infection Diabetes: A1c checked today. Continue taking invokana and metformin Handicap placard form filled out today  I have ordered the following labs for you:   Lab Orders         Urinalysis, Reflex Microscopic         POC Hbg A1C       Referrals ordered today:    Referral Orders         Ambulatory referral to Gastroenterology         Ambulatory referral to Breast Clinic      I have ordered the following medication/changed the following medications:   Stop the following medications: There are no discontinued medications.   Start the following medications: Meds ordered this encounter  Medications   pantoprazole (PROTONIX) 40 MG tablet    Sig: Take 1 tablet (40 mg total) by mouth daily.    Dispense:  30 tablet    Refill:  1   DULoxetine (CYMBALTA) 30 MG capsule    Sig: Take 1 capsule (30 mg total) by mouth daily for 14 days, THEN 2 capsules (60 mg total) daily for 14 days.    Dispense:  42 capsule    Refill:  0     Follow up: 2 months   Should you have any questions or concerns please call the internal medicine clinic at [phone].     Buddy Duty, D.O. [place]

## 2020-11-29 NOTE — Progress Notes (Signed)
Internal Medicine Clinic Attending ° °I saw and evaluated the patient.  I personally confirmed the key portions of the history and exam documented by Dr. Atway and I reviewed pertinent patient test results.  The assessment, diagnosis, and plan were formulated together and I agree with the documentation in the resident’s note.  °

## 2020-11-29 NOTE — Assessment & Plan Note (Addendum)
Last A1c 8.3. Patient has been taking Invokana and metformin everyday as prescribed. A1c increased today to 8.7. Discussed with the patient that she will likely need medication changes to help get her diabetes under tighter control.   Plan: - Continue Invokana and metformin - Follow up in ~6-8 weeks (for chronic pain, also) to discuss medication adjustments

## 2020-11-29 NOTE — Assessment & Plan Note (Signed)
Patient states that she recently has been experiencing a lot of heartburn in her chest and throat. She has never had acid reflux before, nor has she ever had an EGD. She says that the heartburn often makes her feel nauseous, but she denies any vomiting. The patient also denies abdominal pain, hematochezia, melena, diarrhea, or constipation.   Plan: - Start protonix 40 mg daily - Referral to GI for EGD (red flag: new onset GERD in patient >60 yrs old)

## 2020-11-29 NOTE — Progress Notes (Signed)
   CC: left breast mass/pain  HPI:  Ruth Blackburn is a 60 y.o. patient with HTN and diabetes who presents to the [facility] regarding a left sided breast mass. Please see problem-based list for further details, assessments, and plans.   Past Medical History:  Diagnosis Date   Arthritis    COVID-19 04/2020   Diabetes ([place])    Hypertension    Neuropathy    Obesity    Review of Systems:  Review of Systems  Constitutional:  Negative for chills and fever.  HENT:  Negative for congestion and sore throat.   Eyes: Negative.   Respiratory:  Negative for cough and shortness of breath.   Cardiovascular:  Negative for chest pain and palpitations.  Gastrointestinal:  Positive for heartburn and nausea. Negative for abdominal pain, blood in stool, diarrhea, melena and vomiting.  Genitourinary:  Positive for urgency. Negative for dysuria, flank pain and hematuria.  Musculoskeletal:  Positive for back pain, joint pain and myalgias. Negative for falls.  Neurological:  Negative for dizziness and headaches.    Physical Exam:  Vitals:   11/29/20 1032  BP: (!) 142/76  SpO2: 98%  Weight: (!) 309 lb 3.2 oz (140.3 kg)  Height: 5\' 5"  (1.651 m)   General: No acute distress. CV: RRR. No murmurs, rubs, or gallops. No LE edema Pulmonary: Lungs CTAB. Normal effort. No wheezing or rales. Abdominal: Soft, nontender. Obese abdomen. Normal bowel sounds. Extremities/MSK: Palpable radial and DP pulses. Normal ROM. Normal gait, but slow and unable to walk >100 ft secondary to pain. Breast: Breast scars bilaterally consistent with breast reduction surgery. 1 cm palpable mass in the left lower quadrant of left breast.  Skin: Warm and dry. No obvious rash or lesions. Neuro: A&Ox3. Moves all extremities. Normal sensation. No focal deficit.  Psych: Normal mood and affect   Assessment & Plan:   See Encounters Tab for problem based charting.  Patient seen with Dr. Evette Doffing

## 2020-11-29 NOTE — Assessment & Plan Note (Addendum)
Patient states that the mass/lump she felt in May is still present in her left breast. It is not causing her any pain and has not drained any fluid. The patient states that she just wanted to get it evaluated today, as she was told it would likely go away in a few weeks, and it did not. On exam, a small 1 cm circular mass was palpated in the left lower quadrant of her left breast. Could be secondary to an epidermal inclusion cyst, however, it is deeper than would be expected of that diagnosis and also has been present for 4 months. Low suspicion for breast cancer given patient has no personal or family history of this. She had a hysterectomy around 2004 and has been in menopause since then. Patient denies any use of hormone replacement therapy. Of note, patient had a normal mammogram in Dec 2021.   Plan: - Referral to breast clinic for ultrasound

## 2020-11-30 LAB — URINALYSIS, ROUTINE W REFLEX MICROSCOPIC
Bilirubin, UA: NEGATIVE
Leukocytes,UA: NEGATIVE
Nitrite, UA: NEGATIVE
Protein,UA: NEGATIVE
RBC, UA: NEGATIVE
Specific Gravity, UA: >=1.03 — AB (ref 1.005–1.030)
Urobilinogen, Ur: 0.2 mg/dL (ref 0.2–1.0)
pH, UA: 5.5 (ref 5.0–7.5)

## 2020-12-05 ENCOUNTER — Other Ambulatory Visit: Payer: Self-pay

## 2020-12-05 ENCOUNTER — Ambulatory Visit (INDEPENDENT_AMBULATORY_CARE_PROVIDER_SITE_OTHER): Payer: Medicare Other | Admitting: Podiatry

## 2020-12-05 DIAGNOSIS — M629 Disorder of muscle, unspecified: Secondary | ICD-10-CM

## 2020-12-05 DIAGNOSIS — M722 Plantar fascial fibromatosis: Secondary | ICD-10-CM

## 2020-12-05 DIAGNOSIS — M79671 Pain in right foot: Secondary | ICD-10-CM

## 2020-12-05 DIAGNOSIS — G8929 Other chronic pain: Secondary | ICD-10-CM | POA: Diagnosis not present

## 2020-12-05 NOTE — Patient Instructions (Signed)
I have ordered a MRI of the right ankle for [facility]. If you do not hear for them about scheduling within the next 1 week, or you have any questions please give [place] a call at [phone].

## 2020-12-09 ENCOUNTER — Telehealth: Payer: Self-pay

## 2020-12-09 NOTE — Telephone Encounter (Signed)
Requesting test results, please call pt back.  

## 2020-12-10 NOTE — Progress Notes (Signed)
Subjective: 60 year old female presents the office today for concerns of right heel pain which is reoccurring.  She states the pain is ongoing and not getting any better the bottom of her right heel.  She thinks the bone spurs causing her pain.  She is in wearing the brace.  Denies any recent injury or trauma or any other changes.   Objective: AAO x3, NAD DP/PT pulses palpable bilaterally, CRT less than 3 seconds There is continuation of tenderness to palpation along the plantar medial tubercle of the calcaneus at the insertion of plantar fascia on the right foot. There is no pain along the course of the plantar fascia within the arch of the foot. Plantar fascia appears to be intact. There is no pain with lateral compression of the calcaneus or pain with vibratory sensation. There is no pain along the course or insertion of the achilles tendon. No other areas of tenderness to bilateral lower extremities. No pain with calf compression, swelling, warmth, erythema  Assessment: Right heel pain, plantar fasciitis  Plan: -All treatment options discussed with the patient including all alternatives, risks, complications.  -She states that injections have not been helpful.  She is still doing stretching, icing as well as using the brace.  This point given the progressive nature of her discomfort and ordered MRI for potential surgical planning.  Trula Slade DPM

## 2020-12-11 NOTE — Telephone Encounter (Signed)
Called patient to discuss results. Advised her that A1c is 8.7 and with glucosuria and trace ketonuria, she would benefit from tighter glucose control. Discussed recommendation to start GLP-1 agonist; however, patient is very hesitant about starting a third medication. Offered referral to [facility]; however, patient states "I know what I need to do". She does not want an in-person appointment at this time to further discuss her diabetes regimen. She would like to defer any medication changes to January on her A1c recheck.

## 2020-12-13 ENCOUNTER — Telehealth: Payer: Self-pay | Admitting: *Deleted

## 2020-12-13 NOTE — Telephone Encounter (Signed)
Patient has an appointment on 12/22/2020 at [facility] imaging. Lattie Haw

## 2020-12-13 NOTE — Telephone Encounter (Signed)
-----   Message from Trula Slade, DPM sent at 12/10/2020  7:43 AM EDT ----- I have ordered the MRI can you please follow-up on this?  Thank you.

## 2020-12-22 ENCOUNTER — Ambulatory Visit
Admission: RE | Admit: 2020-12-22 | Discharge: 2020-12-22 | Disposition: A | Discharge status: Home / Self Care | Payer: Medicare Other | Source: Ambulatory Visit | Attending: Podiatry | Admitting: Podiatry

## 2020-12-22 DIAGNOSIS — M629 Disorder of muscle, unspecified: Secondary | ICD-10-CM

## 2020-12-25 ENCOUNTER — Other Ambulatory Visit: Payer: Self-pay | Admitting: Podiatry

## 2020-12-25 DIAGNOSIS — S96911S Strain of unspecified muscle and tendon at ankle and foot level, right foot, sequela: Secondary | ICD-10-CM

## 2020-12-25 DIAGNOSIS — M722 Plantar fascial fibromatosis: Secondary | ICD-10-CM

## 2020-12-31 ENCOUNTER — Telehealth: Payer: Self-pay | Admitting: Internal Medicine

## 2020-12-31 NOTE — Addendum Note (Signed)
Addended by: Riesa Pope on: 12/31/2020 01:15 PM   Modules accepted: Orders

## 2020-12-31 NOTE — Telephone Encounter (Signed)
New order placed. Thanks

## 2020-12-31 NOTE — Telephone Encounter (Signed)
Rec'd call from the patient stating she is unable to sch her Ultrasound due to the [facility] not seeing an order placed.  Please advise if you can please place the correct order.  Order to be corrected is listed below and was placed 11/29/2020:  Type Date User Summary Attachment  Provider Comments 11/29/2020 12:25 PM Dorethea Clan, DO Provider Comments -  Note   Ultrasound left breast         The patient states she is really worried about the lump in her breast and wants to be seen as soon as possible.

## 2021-01-07 ENCOUNTER — Encounter: Payer: Self-pay | Admitting: *Deleted

## 2021-01-07 NOTE — Progress Notes (Unsigned)

## 2021-01-07 NOTE — Progress Notes (Unsigned)

## 2021-01-13 ENCOUNTER — Other Ambulatory Visit: Payer: Self-pay | Admitting: Student in an Organized Health Care Education/Training Program

## 2021-01-13 DIAGNOSIS — N6320 Unspecified lump in the left breast, unspecified quadrant: Secondary | ICD-10-CM

## 2021-01-14 ENCOUNTER — Encounter: Payer: Self-pay | Admitting: Gastroenterology

## 2021-01-14 ENCOUNTER — Ambulatory Visit (INDEPENDENT_AMBULATORY_CARE_PROVIDER_SITE_OTHER): Payer: Medicare Other | Admitting: Gastroenterology

## 2021-01-14 DIAGNOSIS — K219 Gastro-esophageal reflux disease without esophagitis: Secondary | ICD-10-CM

## 2021-01-14 DIAGNOSIS — R110 Nausea: Secondary | ICD-10-CM

## 2021-01-14 MED ORDER — PANTOPRAZOLE SODIUM 40 MG PO TBEC: 40.0000 mg | DELAYED_RELEASE_TABLET | Freq: Every day | ORAL | 3 refills | Status: DC

## 2021-01-14 NOTE — Progress Notes (Signed)
Referring Provider: Riesa Pope, * Primary Care Physician:  Riesa Pope, MD  Chief complaint:  New onset reflux and nausea   IMPRESSION:  New onset reflux with associated nausea    - no prior history of similar symptoms    - some improvement with empiric PPI therapy    - no alarm features, although unusual acute onset of symptoms Tubular adenoma and 2 hyperplastic polyps on colonoscopy 10/25/20    - surveillance colonoscopy recommended in 5 years given the family history FIT+ without overt bleeding with colonoscopy 10/25/20 BMI 52 Family history of colon polyps (sister)  PLAN: - Resume pantoprazole 40 mg QAM for at least 8 weeks - Avoid all NSAIDs - EGD with possible biopsies - Surveillance colonoscopy 2027, earlier with new symptoms  Please see the "Patient Instructions" section for addition details about the plan.   HPI: Ruth Blackburn is a 60 y.o. female initially referred by Dr. Heber Wolfe for a positive FIT. Had colonoscopy 10/24/20. Was asymptomatic prior to her colonoscopy. Presents now with recent onset of reflux and nausea. The history is obtined through the patient and review of her electronic health record. She has hypertension, arthritis, diabetes, and neuropathy. Retired/disabled due to arthritis.   FIT + 09/19/20 Ferritin 100, hemoglobin 14, platelets 329 04/07/20  No symptoms associated with the + FIT at the time of office visit 10/24/20.  No overt GI blood loss. No melena, hematochezia, bright red blood per rectum. No epistaxis, vaginal bleeding, hemoptysis, or hematuria.    Colonoscopy 10/24/20: - Non-bleeding internal hemorrhoids. - Two 1 to 2 mm hyperplastic polyps in the rectum, removed with a cold snare.  - One 6 mm tubular in the distal transverse colon, removed with a cold snare. - The examination was otherwise normal on direct and retroflexion views.  Presents now after developing heartburn with atypical chest pain with associated  regurgitation and nausea a couple of weeks ago. Frequent eructation with eructation providing temporary relief.  Started acid reflux medications x 4 days. Symptoms improved so she stopped taking them. No history of similar symptoms prior to that time. No dysphonia, dysphagia, odynophagia, blood in the stool. No new medications, diet changes, or psychosocial stressors.   Sister with colon polyps. No other known family history of colon cancer or polyps. No family history of uterine/endometrial cancer, pancreatic cancer or gastric/stomach cancer.   Past Medical History:  Diagnosis Date   Arthritis    COVID-19 04/2020   Diabetes ([place])    GERD (gastroesophageal reflux disease)    Hypertension    Neuropathy    Obesity     Past Surgical History:  Procedure Laterality Date   ABDOMINAL HYSTERECTOMY  2006   BREAST REDUCTION SURGERY  2004   COLONOSCOPY WITH PROPOFOL N/A 10/24/2020   Procedure: COLONOSCOPY WITH PROPOFOL;  Surgeon: Thornton Park, MD;  Location: [facility];  Service: Gastroenterology;  Laterality: N/A;   POLYPECTOMY  10/24/2020   Procedure: POLYPECTOMY;  Surgeon: Thornton Park, MD;  Location: [facility]OS[facility];  Service: Gastroenterology;;   REDUCTION MAMMAPLASTY Bilateral 2004    Current Outpatient Medications  Medication Sig Dispense Refill   amLODipine (NORVASC) 10 MG tablet Take 1 tablet (10 mg total) by mouth daily. 90 tablet 3   atorvastatin (LIPITOR) 20 MG tablet Take 1 tablet (20 mg total) by mouth daily. 30 tablet 11   canagliflozin (INVOKANA) 300 MG TABS tablet Take 1 tablet (300 mg total) by mouth daily before breakfast. 30 tablet 9   DULoxetine (CYMBALTA) 30 MG capsule Take  1 capsule (30 mg total) by mouth daily for 14 days, THEN 2 capsules (60 mg total) daily. 60 capsule 1   losartan (COZAAR) 25 MG tablet Take 1 tablet (25 mg total) by mouth daily. 90 tablet 3   metFORMIN (GLUCOPHAGE) 1000 MG tablet Take 1 tablet (1,000 mg total) by mouth 2 (two) times daily with  a meal. 180 tablet 3   metoprolol succinate (TOPROL-XL) 100 MG 24 hr tablet Take 1 tablet (100 mg total) by mouth daily. Take with or immediately following a meal. 90 tablet 3   pantoprazole (PROTONIX) 40 MG tablet Take 1 tablet (40 mg total) by mouth daily. 30 tablet 3   traZODone (DESYREL) 100 MG tablet Take 1 tablet (100 mg total) by mouth at bedtime. Break in half for first week, if still unable to sleep start taking whole pill. (Patient taking differently: Take 100 mg by mouth at bedtime as needed for sleep. Break in half for first week, if still unable to sleep start taking whole pill.) 30 tablet 3   No current facility-administered medications for this visit.    Allergies as of 01/14/2021   (No Known Allergies)    Family History  Problem Relation Age of Onset   Diabetes Mother    Heart failure Mother    CAD Mother    Kidney disease Mother        on dialysis   Hypertension Mother    Diabetes Sister    Colon cancer Neg Hx    Stomach cancer Neg Hx     Physical Exam: General:   Alert,  well-nourished, pleasant and cooperative in NAD Head:  Normocephalic and atraumatic. Eyes:  Sclera clear, no icterus.   Conjunctiva pink. Abdomen:  Soft, central obesity, nontender, nondistended, normal bowel sounds, no rebound or guarding. No hepatosplenomegaly. I am unable to reproduce her symptoms on exam.    Neurologic:  Alert and  oriented x4;  grossly nonfocal Skin:  Intact without significant lesions or rashes. Psych:  Alert and cooperative. Normal mood and affect.     Raye Slyter L. Tarri Glenn, MD, MPH 01/14/2021, 1:13 PM

## 2021-01-14 NOTE — Patient Instructions (Addendum)
If you are age 60 or older, your body mass index should be between 23-30. Your Body mass index is 51.71 kg/m. If this is out of the aforementioned range listed, please consider follow up with your Primary Care Provider.  If you are age 19 or younger, your body mass index should be between 19-25. Your Body mass index is 51.71 kg/m. If this is out of the aformentioned range listed, please consider follow up with your Primary Care Provider.   ________________________________________________________  The [place] [facility] providers would like to encourage you to use [facility] to communicate with providers for non-urgent requests or questions.  Due to long hold times on the telephone, sending your provider a message by [facility] may be a faster and more efficient way to get a response.  Please allow 48 business hours for a response.  Please remember that this is for non-urgent requests.  _______________________________________________________  Dennis Bast have been scheduled for an endoscopy. Please follow written instructions given to you at your visit today. If you use inhalers (even only as needed), please bring them with you on the day of your procedure.  Due to recent changes in healthcare laws, you may see the results of your imaging and laboratory studies on MyChart before your provider has had a chance to review them.  We understand that in some cases there may be results that are confusing or concerning to you. Not all laboratory results come back in the same time frame and the provider may be waiting for multiple results in order to interpret others.  Please give [place] 48 hours in order for your provider to thoroughly review all the results before contacting the office for clarification of your results.   RESTART: Pantoprazole 40mg  one tablet daily in the morning.  Thank you for entrusting me with your care and choosing [facility].  Dr Tarri Glenn

## 2021-01-30 ENCOUNTER — Other Ambulatory Visit: Payer: Self-pay

## 2021-01-30 ENCOUNTER — Telehealth: Payer: Self-pay

## 2021-01-30 ENCOUNTER — Other Ambulatory Visit: Payer: Self-pay | Admitting: Student

## 2021-01-30 DIAGNOSIS — R110 Nausea: Secondary | ICD-10-CM

## 2021-01-30 DIAGNOSIS — K219 Gastro-esophageal reflux disease without esophagitis: Secondary | ICD-10-CM

## 2021-01-30 DIAGNOSIS — E1142 Type 2 diabetes mellitus with diabetic polyneuropathy: Secondary | ICD-10-CM

## 2021-01-30 NOTE — Telephone Encounter (Signed)
-----   Message from Thornton Park, MD sent at 01/30/2021  1:23 PM EST ----- Regarding: FW: [place] pt Please reschedule the patient for the hospital.  Thanks.   KLB ----- Message ----- From: Osvaldo Angst, CRNA Sent: 01/30/2021   1:22 PM EST To: Thornton Park, MD Subject: [facility] pt                                         Dr. Tarri Glenn,  This pt is scheduled for a procedure on 12/7.  Her BMI is >51 so she does not qualify for care at [facility].  JMN

## 2021-01-30 NOTE — Telephone Encounter (Signed)
Can we please schedule Ruth Blackburn for an appointment to follow up on her diabetes? She may need medications adjustment and we may be able to combine her invokana and metformin to decrease pill burden. Thank you!

## 2021-01-30 NOTE — Telephone Encounter (Signed)
Called pt to advise about need to cancel [facility] EGD procedure d/t BMI > 50 per anesthesia's request. Advised procedure has been rescheduled to [facility] on 03/27/21 @ 1pm, arrival time 1130am. Per pt request, new prep instructions sent via My Chart. New amb referral placed for auth purposes.

## 2021-02-05 ENCOUNTER — Encounter: Payer: Medicare Other | Admitting: Gastroenterology

## 2021-02-28 ENCOUNTER — Ambulatory Visit
Admission: RE | Admit: 2021-02-28 | Discharge: 2021-02-28 | Disposition: A | Discharge status: Home / Self Care | Payer: Medicare Other | Source: Ambulatory Visit | Attending: Student in an Organized Health Care Education/Training Program | Admitting: Student in an Organized Health Care Education/Training Program

## 2021-02-28 ENCOUNTER — Ambulatory Visit: Payer: Medicare Other

## 2021-02-28 DIAGNOSIS — N6320 Unspecified lump in the left breast, unspecified quadrant: Secondary | ICD-10-CM

## 2021-03-12 ENCOUNTER — Ambulatory Visit (INDEPENDENT_AMBULATORY_CARE_PROVIDER_SITE_OTHER): Payer: Medicare Other | Admitting: Student

## 2021-03-12 ENCOUNTER — Encounter: Payer: Self-pay | Admitting: Student

## 2021-03-12 ENCOUNTER — Other Ambulatory Visit (HOSPITAL_COMMUNITY): Payer: Self-pay

## 2021-03-12 VITALS — BP 145/62 | HR 93 | Temp 98.3°F | Ht 64.0 in | Wt 305.8 lb

## 2021-03-12 DIAGNOSIS — Z23 Encounter for immunization: Secondary | ICD-10-CM | POA: Diagnosis not present

## 2021-03-12 DIAGNOSIS — E1142 Type 2 diabetes mellitus with diabetic polyneuropathy: Secondary | ICD-10-CM

## 2021-03-12 DIAGNOSIS — I10 Essential (primary) hypertension: Secondary | ICD-10-CM

## 2021-03-12 DIAGNOSIS — G629 Polyneuropathy, unspecified: Secondary | ICD-10-CM

## 2021-03-12 DIAGNOSIS — Z0000 Encounter for general adult medical examination without abnormal findings: Secondary | ICD-10-CM

## 2021-03-12 LAB — GLUCOSE, CAPILLARY: Glucose-Capillary: 172 mg/dL — ABNORMAL HIGH (ref 70–99)

## 2021-03-12 LAB — POCT GLYCOSYLATED HEMOGLOBIN (HGB A1C): Hemoglobin A1C: 9 % — AB (ref 4.0–5.6)

## 2021-03-12 MED ORDER — LOSARTAN POTASSIUM 25 MG PO TABS: 50.0000 mg | ORAL_TABLET | Freq: Every day | ORAL | 3 refills | Status: AC

## 2021-03-12 MED ORDER — OZEMPIC (0.25 OR 0.5 MG/DOSE) 2 MG/1.5ML ~~LOC~~ SOPN
0.5000 mg | PEN_INJECTOR | SUBCUTANEOUS | 3 refills | Status: DC
  Filled 2021-03-12: qty 1.5, 42d supply, fill #0
  Filled 2021-04-14: qty 1.5, 28d supply, fill #1
  Filled 2021-05-27: qty 1.5, 28d supply, fill #2

## 2021-03-12 NOTE — Patient Instructions (Signed)
For your weight loss we discussed dietary changes as well as starting a medicine call ozempic which will also help with your diabetes. In addition, please try to use swimming and biking to help prevent worsening of your lower extremity pain.   For your diabetes, we will check your labs and start ozempic. We will also refer you to our diabetes educator.

## 2021-03-13 LAB — MICROALBUMIN / CREATININE URINE RATIO
Creatinine, Urine: 192.5 mg/dL
Microalb/Creat Ratio: 10 mg/g creat (ref 0–29)
Microalbumin, Urine: 18.5 ug/mL

## 2021-03-13 LAB — BMP8+ANION GAP
Anion Gap: 14 mmol/L (ref 10.0–18.0)
BUN/Creatinine Ratio: 20 (ref 12–28)
BUN: 12 mg/dL (ref 8–27)
CO2: 23 mmol/L (ref 20–29)
Calcium: 9.7 mg/dL (ref 8.7–10.3)
Chloride: 103 mmol/L (ref 96–106)
Creatinine, Ser: 0.6 mg/dL (ref 0.57–1.00)
Glucose: 152 mg/dL — ABNORMAL HIGH (ref 70–99)
Potassium: 4.3 mmol/L (ref 3.5–5.2)
Sodium: 140 mmol/L (ref 134–144)
eGFR: 103 mL/min/{1.73_m2} (ref >59)

## 2021-03-14 NOTE — Assessment & Plan Note (Addendum)
Patient's A1c this clinic visit is 9.0 from 8.7.  She is currently on Invokana 300 mg daily as well as metformin 1000 mg twice a day.  She has had difficulty with trying to lose weight due to her osteoarthritic pain in her knees.  She also reports that although she is cut back on the amount of sodas that she drinks, she still drinks a significant amount.  We discussed the importance of changes to her diet including avoiding sugary drinks and carb heavy foods like pastas and breads.  She agreed and stated that she would do her best to limit these.  In addition, we revisited the topic of starting Ozempic.  She was initially hesitant given she is on multiple medications.  However, discussed with patient that this would be a once weekly injection and given her class IV obesity and uncontrolled blood sugars as well as her young age it is important that we be aggressive with controlling her risk factors to prevent cardiovascular disease in the future.  -Started Ozempic 0.25 mg with instructions to taper up to 0.5 mg if well-tolerated. -Continue Invokana 300 mg daily as well as metformin 1 g twice daily -Encourage patient to increase her activity.  -Patient was not interested in speaking with our diabetes educator at this time.

## 2021-03-14 NOTE — Progress Notes (Signed)
° °  CC: Follow-up of her chronic medical issues  HPI:  [name] is a 61 y.o. female with a past medical history per below who presents for follow-up of her chronic medical issues.Please see problem based charting under encounters tab for further details.    Past Medical History:  Diagnosis Date   Arthritis    COVID-19 04/2020   Diabetes ([place])    GERD (gastroesophageal reflux disease)    Hypertension    Neuropathy    Obesity    Review of Systems:  Please see problem based charting under encounters tab for further details.    Physical Exam:  Vitals:   03/12/21 1314  BP: (!) 145/62  Pulse: 93  Temp: 98.3 F (36.8 C)  TempSrc: Oral  SpO2: 98%  Weight: (!) 305 lb 12.8 oz (138.7 kg)  Height: 5\' 4"  (1.626 m)    Constitutional: well-developed, well-nourished, and in no distress.  HENT:  Head: Normocephalic and atraumatic.  Eyes: EOM are normal.  Neck: Normal range of motion.  Cardiovascular: Normal rate, regular rhythm, normal heart sounds and intact distal pulses. Exam reveals no gallop and no friction rub.  No murmur heard. Pulmonary/Chest: Effort normal and breath sounds normal. No respiratory distress. He exhibits no tenderness.  Abdominal: Soft. Bowel sounds are normal. He exhibits no distension. There is no abdominal tenderness.  Musculoskeletal: Normal range of motion.        General: No tenderness or edema.  Neurological: alert and oriented to person, place, and time. Non focal  Skin: Skin is warm and dry.    Assessment & Plan:   See Encounters Tab for problem based charting.  Patient discussed with Dr.  Saverio Danker

## 2021-03-14 NOTE — Assessment & Plan Note (Signed)
Patient received Tdap vaccine this clinic visit.  She declined pneumonia vaccine.  We will bring this up at next clinic visit.  Patient is also due for a Pap smear.

## 2021-03-14 NOTE — Assessment & Plan Note (Signed)
Discussed with patient that she has class V obesity.  We also discussed all the health implications that this entails including diabetes and hypertension.  We also discussed her her weight is likely increased pain in her lower extremities.  Given that her weight has been significantly elevated for some time the possibility of referring patient to bariatric surgery.  She states that she would like to try weight loss on her own and see [place] in 2 months but that she would be open to discussing surgical options at that time. -We discussed using swimming as patient has Medicare she likely qualifies for free membership at [place], this will also help alleviate pain in her joints -Started patient on Ozempic this clinic visit as well. -She will follow-up in 1 to 50-month

## 2021-03-14 NOTE — Progress Notes (Signed)
Internal Medicine Clinic Attending  Case discussed with Dr. Carter  At the time of the visit.  We reviewed the resident's history and exam and pertinent patient test results.  I agree with the assessment, diagnosis, and plan of care documented in the resident's note.  

## 2021-03-14 NOTE — Assessment & Plan Note (Signed)
Vitals:   03/12/21 1314  BP: (!) 145/62   Patient's blood pressure was not at goal this clinic visit.  When reviewing the EMR it appears that her blood pressure has not been at goal for some time.  This was discussed at last clinic visit patient reports she was hesitant to change her medication at that time.  She is currently on amlodipine 10 mg daily, losartan 25 mg daily, and metoprolol succinate 100 mg daily.  On her current regimen patient denies any lightheadedness, dizziness, orthostasis, chest pain, or palpitations.  Discussed with patient the importance of increasing her activity.  We talked about using water aerobics as opposed to walking given the stress that walking places on her joints.  Plan:  -Increase patient's losartan to 50 mg daily -As patient please bring record of her blood pressures

## 2021-03-18 ENCOUNTER — Encounter (HOSPITAL_COMMUNITY): Payer: Self-pay | Admitting: Gastroenterology

## 2021-03-18 ENCOUNTER — Other Ambulatory Visit: Payer: Self-pay | Admitting: Internal Medicine

## 2021-03-18 DIAGNOSIS — I10 Essential (primary) hypertension: Secondary | ICD-10-CM

## 2021-03-26 ENCOUNTER — Other Ambulatory Visit: Payer: Self-pay | Admitting: Student

## 2021-03-26 DIAGNOSIS — E1142 Type 2 diabetes mellitus with diabetic polyneuropathy: Secondary | ICD-10-CM

## 2021-03-26 NOTE — Telephone Encounter (Signed)
Refill Request   atorvastatin (LIPITOR) 20 MG tablet   [place] ([place]), [place] - [address] (Ph: [phone])

## 2021-03-27 ENCOUNTER — Encounter (HOSPITAL_COMMUNITY): Payer: Self-pay | Admitting: Gastroenterology

## 2021-03-27 ENCOUNTER — Ambulatory Visit (HOSPITAL_COMMUNITY): Payer: Medicare Other | Admitting: Certified Registered Nurse Anesthetist

## 2021-03-27 ENCOUNTER — Ambulatory Visit (HOSPITAL_COMMUNITY)
Admission: RE | Admit: 2021-03-27 | Discharge: 2021-03-27 | Disposition: A | Discharge status: Home / Self Care | Payer: Medicare Other | Attending: Gastroenterology | Admitting: Gastroenterology

## 2021-03-27 ENCOUNTER — Encounter (HOSPITAL_COMMUNITY)
Admission: RE | Disposition: A | Discharge status: Home / Self Care | Payer: Self-pay | Source: Home / Self Care | Attending: Gastroenterology

## 2021-03-27 ENCOUNTER — Other Ambulatory Visit: Payer: Self-pay

## 2021-03-27 DIAGNOSIS — K3189 Other diseases of stomach and duodenum: Secondary | ICD-10-CM

## 2021-03-27 DIAGNOSIS — I10 Essential (primary) hypertension: Secondary | ICD-10-CM | POA: Insufficient documentation

## 2021-03-27 DIAGNOSIS — K2289 Other specified disease of esophagus: Secondary | ICD-10-CM | POA: Diagnosis not present

## 2021-03-27 DIAGNOSIS — Z86010 Personal history of colonic polyps: Secondary | ICD-10-CM | POA: Diagnosis not present

## 2021-03-27 DIAGNOSIS — Z6841 Body Mass Index (BMI) 40.0 and over, adult: Secondary | ICD-10-CM | POA: Diagnosis not present

## 2021-03-27 DIAGNOSIS — E119 Type 2 diabetes mellitus without complications: Secondary | ICD-10-CM | POA: Diagnosis not present

## 2021-03-27 DIAGNOSIS — R110 Nausea: Secondary | ICD-10-CM | POA: Diagnosis not present

## 2021-03-27 DIAGNOSIS — E669 Obesity, unspecified: Secondary | ICD-10-CM | POA: Insufficient documentation

## 2021-03-27 DIAGNOSIS — Z7984 Long term (current) use of oral hypoglycemic drugs: Secondary | ICD-10-CM | POA: Diagnosis not present

## 2021-03-27 DIAGNOSIS — K2990 Gastroduodenitis, unspecified, without bleeding: Secondary | ICD-10-CM

## 2021-03-27 DIAGNOSIS — K449 Diaphragmatic hernia without obstruction or gangrene: Secondary | ICD-10-CM | POA: Insufficient documentation

## 2021-03-27 DIAGNOSIS — Z8371 Family history of colonic polyps: Secondary | ICD-10-CM | POA: Diagnosis not present

## 2021-03-27 DIAGNOSIS — K219 Gastro-esophageal reflux disease without esophagitis: Secondary | ICD-10-CM | POA: Insufficient documentation

## 2021-03-27 DIAGNOSIS — K2950 Unspecified chronic gastritis without bleeding: Secondary | ICD-10-CM | POA: Insufficient documentation

## 2021-03-27 DIAGNOSIS — Z87891 Personal history of nicotine dependence: Secondary | ICD-10-CM | POA: Insufficient documentation

## 2021-03-27 DIAGNOSIS — K2970 Gastritis, unspecified, without bleeding: Secondary | ICD-10-CM

## 2021-03-27 HISTORY — PX: ESOPHAGOGASTRODUODENOSCOPY (EGD) WITH PROPOFOL: SHX5813

## 2021-03-27 HISTORY — PX: BIOPSY: SHX5522

## 2021-03-27 LAB — GLUCOSE, CAPILLARY: Glucose-Capillary: 113 mg/dL — ABNORMAL HIGH (ref 70–99)

## 2021-03-27 SURGERY — ESOPHAGOGASTRODUODENOSCOPY (EGD) WITH PROPOFOL
Anesthesia: Monitor Anesthesia Care

## 2021-03-27 MED ORDER — PROPOFOL 10 MG/ML IV BOLUS
INTRAVENOUS | Status: DC | PRN
  Administered 2021-03-27: 20 mg via INTRAVENOUS
  Administered 2021-03-27: 10 mg via INTRAVENOUS
  Administered 2021-03-27: 20 mg via INTRAVENOUS
  Administered 2021-03-27: 30 mg via INTRAVENOUS
  Administered 2021-03-27: 20 mg via INTRAVENOUS

## 2021-03-27 MED ORDER — SODIUM CHLORIDE 0.9 % IV SOLN: INTRAVENOUS | Status: DC

## 2021-03-27 MED ORDER — LIDOCAINE 2% (20 MG/ML) 5 ML SYRINGE
INTRAMUSCULAR | Status: DC | PRN
Start: 2021-03-27 — End: 2021-03-27
  Administered 2021-03-27: 100 mg via INTRAVENOUS

## 2021-03-27 MED ORDER — PROPOFOL 500 MG/50ML IV EMUL
INTRAVENOUS | Status: DC | PRN
  Administered 2021-03-27: 125 ug/kg/min via INTRAVENOUS

## 2021-03-27 MED ORDER — LACTATED RINGERS IV SOLN: INTRAVENOUS | Status: DC

## 2021-03-27 SURGICAL SUPPLY — 15 items

## 2021-03-27 NOTE — Op Note (Signed)
[facility] Patient Name: Ruth Blackburn Procedure Date: 03/27/2021 MRN: [id number] Attending MD: Thornton Park MD, MD Date of Birth: 12/04/1960 CSN: [id number] Age: 61 Admit Type: Outpatient Procedure:                Upper GI endoscopy Indications:              Suspected esophageal reflux, Nausea Providers:                Thornton Park MD, MD, Mikey College, RN,                            Benetta Spar, Technician Referring MD:              Medicines:                Monitored Anesthesia Care Complications:            No immediate complications. Estimated blood loss:                            Minimal. Estimated Blood Loss:     Estimated blood loss was minimal. Procedure:                Pre-Anesthesia Assessment:                           - Prior to the procedure, a History and Physical                            was performed, and patient medications and                            allergies were reviewed. The patient's tolerance of                            previous anesthesia was also reviewed. The risks                            and benefits of the procedure and the sedation                            options and risks were discussed with the patient.                            All questions were answered, and informed consent                            was obtained. Prior Anticoagulants: The patient has                            taken no previous anticoagulant or antiplatelet                            agents. ASA Grade Assessment: III - A patient with  severe systemic disease. After reviewing the risks                            and benefits, the patient was deemed in                            satisfactory condition to undergo the procedure.                           After obtaining informed consent, the endoscope was                            passed under direct vision. Throughout the                            procedure,  the patient's blood pressure, pulse, and                            oxygen saturations were monitored continuously. The                            [id number]-[id number] ([id number] Olympus endoscope was introduced                            through the mouth, and advanced to the third part                            of duodenum. The upper GI endoscopy was                            accomplished without difficulty. The patient                            tolerated the procedure well. Scope In: Scope Out: Findings:      Mild mucosal changes characterized by congestion were found in the       distal esophagus. The z-line is located 36 cm from the incisors.       Biopsies were taken from the mid/proximal and distal esophagus with a       cold forceps for histology. Estimated blood loss was minimal.      Patchy mildly erythematous mucosa without bleeding was found in the       gastric fundus, in the gastric body and in the gastric antrum. Biopsies       were taken from the antrum, body, and fundus with a cold forceps for       histology. Estimated blood loss was minimal. A small hiatal hernia is       present.      The examined duodenum was normal. Impression:               - Congested mucosa in the esophagus. Biopsied.                           - Erythematous mucosa in the gastric fundus,  gastric body and antrum. Biopsied.                           - Small hiatal hernia.                           - Normal examined duodenum. Moderate Sedation:      Not Applicable - Patient had care per Anesthesia. Recommendation:           - Patient has a contact number available for                            emergencies. The signs and symptoms of potential                            delayed complications were discussed with the                            patient. Return to normal activities tomorrow.                            Written discharge instructions were provided to the                             patient.                           - Resume previous diet.                           - Continue present medications.                           - Await pathology results.                           - No aspirin, ibuprofen, naproxen, or other                            non-steroidal anti-inflammatory drugs.                           - Await pathology results. Procedure Code(s):        --- Professional ---                           [phone], Esophagogastroduodenoscopy, flexible,                            transoral; with biopsy, single or multiple Diagnosis Code(s):        --- Professional ---                           K22.8, Other specified diseases of esophagus                           K31.89, Other diseases of stomach and duodenum  R11.0, Nausea CPT copyright 2019 American Medical Association. All rights reserved. The codes documented in this report are preliminary and upon coder review may  be revised to meet current compliance requirements. Thornton Park MD, MD 03/27/2021 12:49:35 PM This report has been signed electronically. Number of Addenda: 0

## 2021-03-27 NOTE — H&P (Signed)
° °  Referring Provider: No ref. provider found Primary Care Physician:  Riesa Pope, MD  Indication for Procedure:  New onset reflux and nausea   IMPRESSION:  New onset reflux with associated nausea    - no prior history of similar symptoms    - some improvement with empiric PPI therapy    - no alarm features, although unusual acute onset of symptoms Tubular adenoma and 2 hyperplastic polyps on colonoscopy 10/25/20    - surveillance colonoscopy recommended in 5 years given the family history FIT+ without overt bleeding with colonoscopy 10/25/20 BMI 91 Family history of colon polyps (sister)  PLAN: EGD with possible biopsies   HPI: Ruth Blackburn is a 61 y.o. female presents for endoscopic evaluation of reflux. Developed heartburn with atypical chest pain with associated regurgitation and nausea this fall. Frequent eructation with eructation providing temporary relief.  Started acid reflux medications x 4 days. Symptoms improved so she stopped taking them. No history of similar symptoms prior to that time. No dysphonia, dysphagia, odynophagia, blood in the stool. No new medications, diet changes, or psychosocial stressors.   Past Medical History:  Diagnosis Date   Arthritis    COVID-19 04/2020   Diabetes ([place])    GERD (gastroesophageal reflux disease)    Hypertension    Neuropathy    Obesity     Past Surgical History:  Procedure Laterality Date   ABDOMINAL HYSTERECTOMY  2006   BREAST REDUCTION SURGERY  2004   COLONOSCOPY WITH PROPOFOL N/A 10/24/2020   Procedure: COLONOSCOPY WITH PROPOFOL;  Surgeon: Thornton Park, MD;  Location: [facility];  Service: Gastroenterology;  Laterality: N/A;   POLYPECTOMY  10/24/2020   Procedure: POLYPECTOMY;  Surgeon: Thornton Park, MD;  Location: [facility];  Service: Gastroenterology;;   REDUCTION MAMMAPLASTY Bilateral 2004    No current facility-administered medications for this encounter.    Allergies as of 01/30/2021    (No Known Allergies)    Family History  Problem Relation Age of Onset   Diabetes Mother    Heart failure Mother    CAD Mother    Kidney disease Mother        on dialysis   Hypertension Mother    Diabetes Sister    Colon cancer Neg Hx    Stomach cancer Neg Hx     Physical Exam: General:   Alert,  well-nourished, pleasant and cooperative in NAD Head:  Normocephalic and atraumatic. Eyes:  Sclera clear, no icterus.   Conjunctiva pink. Abdomen:  Soft, central obesity, nontender, nondistended, normal bowel sounds, no rebound or guarding. No hepatosplenomegaly. I am unable to reproduce her symptoms on exam.    Neurologic:  Alert and  oriented x4;  grossly nonfocal Skin:  Intact without significant lesions or rashes. Psych:  Alert and cooperative. Normal mood and affect.     Waverly Chavarria L. Tarri Glenn, MD, MPH 03/27/2021, 11:05 AM

## 2021-03-27 NOTE — Transfer of Care (Signed)
Immediate Anesthesia Transfer of Care Note  Patient: Ruth Blackburn  Procedure(s) Performed: ESOPHAGOGASTRODUODENOSCOPY (EGD) WITH PROPOFOL BIOPSY  Patient Location: Endoscopy Unit  Anesthesia Type:MAC  Level of Consciousness: awake, alert  and oriented  Airway & Oxygen Therapy: Patient Spontanous Breathing and Patient connected to face mask oxygen  Post-op Assessment: Report given to RN and Post -op Vital signs reviewed and stable  Post vital signs: Reviewed and stable  Last Vitals:  Vitals Value Taken Time  BP 150/92 03/27/21 1255  Temp 35.8 C 03/27/21 1250  Pulse 83 03/27/21 1258  Resp 17 03/27/21 1258  SpO2 94 % 03/27/21 1258  Vitals shown include unvalidated device data.  Last Pain:  Vitals:   03/27/21 1250  TempSrc: Tympanic  PainSc:          Complications: No notable events documented.

## 2021-03-27 NOTE — Anesthesia Preprocedure Evaluation (Addendum)
Anesthesia Evaluation  Patient identified by MRN, date of birth, ID band Patient awake    Reviewed: Allergy & Precautions, NPO status , Patient's Chart, lab work & pertinent test results  Airway Mallampati: I  TM Distance: >3 FB Neck ROM: Full    Dental no notable dental hx. (+) Teeth Intact, Dental Advisory Given   Pulmonary neg pulmonary ROS, former smoker,    Pulmonary exam normal breath sounds clear to auscultation       Cardiovascular hypertension, Pt. on medications Normal cardiovascular exam Rhythm:Regular Rate:Normal     Neuro/Psych negative neurological ROS  negative psych ROS   GI/Hepatic Neg liver ROS, GERD  ,  Endo/Other  diabetes, Type 2, Oral Hypoglycemic AgentsMorbid obesity (BMI 52)  Renal/GU negative Renal ROS  negative genitourinary   Musculoskeletal negative musculoskeletal ROS (+)   Abdominal   Peds  Hematology negative hematology ROS (+)   Anesthesia Other Findings   Reproductive/Obstetrics                            Anesthesia Physical Anesthesia Plan  ASA: 3  Anesthesia Plan: MAC   Post-op Pain Management:    Induction: Intravenous  PONV Risk Score and Plan: Propofol infusion and Treatment may vary due to age or medical condition  Airway Management Planned: Natural Airway  Additional Equipment:   Intra-op Plan:   Post-operative Plan:   Informed Consent: I have reviewed the patients History and Physical, chart, labs and discussed the procedure including the risks, benefits and alternatives for the proposed anesthesia with the patient or authorized representative who has indicated his/her understanding and acceptance.     Dental advisory given  Plan Discussed with: CRNA  Anesthesia Plan Comments:         Anesthesia Quick Evaluation

## 2021-03-27 NOTE — Anesthesia Postprocedure Evaluation (Signed)
Anesthesia Post Note  Patient: Ruth Blackburn  Procedure(s) Performed: ESOPHAGOGASTRODUODENOSCOPY (EGD) WITH PROPOFOL BIOPSY     Patient location during evaluation: Endoscopy Anesthesia Type: MAC Level of consciousness: awake and alert Pain management: pain level controlled Vital Signs Assessment: post-procedure vital signs reviewed and stable Respiratory status: spontaneous breathing, nonlabored ventilation, respiratory function stable and patient connected to nasal cannula oxygen Cardiovascular status: blood pressure returned to baseline and stable Postop Assessment: no apparent nausea or vomiting Anesthetic complications: no   No notable events documented.  Last Vitals:  Vitals:   03/27/21 1300 03/27/21 1310  BP: (!) 163/86   Pulse: 86 83  Resp: 14 18  Temp:    SpO2: 95% 94%    Last Pain:  Vitals:   03/27/21 1250  TempSrc: Tympanic  PainSc:                  Taran Haynesworth L Ayo Smoak

## 2021-03-28 ENCOUNTER — Other Ambulatory Visit: Payer: Self-pay

## 2021-03-28 ENCOUNTER — Encounter (HOSPITAL_COMMUNITY): Payer: Self-pay | Admitting: Gastroenterology

## 2021-03-28 DIAGNOSIS — E1142 Type 2 diabetes mellitus with diabetic polyneuropathy: Secondary | ICD-10-CM

## 2021-03-28 MED ORDER — METFORMIN HCL 1000 MG PO TABS: 1000.0000 mg | ORAL_TABLET | Freq: Two times a day (BID) | ORAL | 3 refills | Status: AC

## 2021-03-28 MED ORDER — ATORVASTATIN CALCIUM 20 MG PO TABS: 20.0000 mg | ORAL_TABLET | Freq: Every day | ORAL | 11 refills | Status: DC

## 2021-03-31 LAB — SURGICAL PATHOLOGY

## 2021-04-03 ENCOUNTER — Other Ambulatory Visit: Payer: Self-pay

## 2021-04-03 DIAGNOSIS — A048 Other specified bacterial intestinal infections: Secondary | ICD-10-CM

## 2021-04-03 MED ORDER — PANTOPRAZOLE SODIUM 40 MG PO TBEC: 40.0000 mg | DELAYED_RELEASE_TABLET | Freq: Two times a day (BID) | ORAL | 0 refills | Status: DC

## 2021-04-03 MED ORDER — BISMUTH SUBSALICYLATE 262 MG PO CHEW: 262.0000 mg | CHEWABLE_TABLET | Freq: Four times a day (QID) | ORAL | 0 refills | Status: AC

## 2021-04-03 MED ORDER — METRONIDAZOLE 500 MG PO TABS: 500.0000 mg | ORAL_TABLET | Freq: Two times a day (BID) | ORAL | 0 refills | Status: AC

## 2021-04-03 MED ORDER — TETRACYCLINE HCL 500 MG PO CAPS: 500.0000 mg | ORAL_CAPSULE | Freq: Four times a day (QID) | ORAL | 0 refills | Status: AC

## 2021-04-14 ENCOUNTER — Other Ambulatory Visit (HOSPITAL_COMMUNITY): Payer: Self-pay

## 2021-05-27 ENCOUNTER — Other Ambulatory Visit (HOSPITAL_COMMUNITY): Payer: Self-pay

## 2021-06-16 ENCOUNTER — Encounter: Payer: Self-pay | Admitting: Gastroenterology

## 2021-06-30 ENCOUNTER — Other Ambulatory Visit: Payer: Medicare Other

## 2021-06-30 DIAGNOSIS — A048 Other specified bacterial intestinal infections: Secondary | ICD-10-CM

## 2021-07-01 LAB — HELICOBACTER PYLORI  SPECIAL ANTIGEN
MICRO NUMBER:: 13333596
RESULT:: DETECTED — AB
SPECIMEN QUALITY: ADEQUATE

## 2021-07-02 ENCOUNTER — Other Ambulatory Visit: Payer: Self-pay

## 2021-07-02 DIAGNOSIS — A048 Other specified bacterial intestinal infections: Secondary | ICD-10-CM

## 2021-07-02 MED ORDER — LEVOFLOXACIN 500 MG PO TABS: 500.0000 mg | ORAL_TABLET | Freq: Every day | ORAL | 0 refills | Status: AC

## 2021-07-02 MED ORDER — PANTOPRAZOLE SODIUM 40 MG PO TBEC: 40.0000 mg | DELAYED_RELEASE_TABLET | Freq: Two times a day (BID) | ORAL | 0 refills | Status: DC

## 2021-07-02 MED ORDER — AMOXICILLIN 250 MG PO CAPS: 750.0000 mg | ORAL_CAPSULE | Freq: Three times a day (TID) | ORAL | 0 refills | Status: AC

## 2021-07-04 ENCOUNTER — Ambulatory Visit (INDEPENDENT_AMBULATORY_CARE_PROVIDER_SITE_OTHER): Payer: Medicare Other | Admitting: Internal Medicine

## 2021-07-04 DIAGNOSIS — Z0000 Encounter for general adult medical examination without abnormal findings: Secondary | ICD-10-CM

## 2021-07-04 NOTE — Progress Notes (Addendum)
Subjective:   Ruth Blackburn is a 61 y.o. female who presents for an Initial Medicare Annual Wellness Visit.I connected with  Ruth Blackburn on 07/04/21 by a audio enabled telemedicine application and verified that I am speaking with the correct person using two identifiers.  Patient Location: Home  Provider Location: Office/Clinic  I discussed the limitations of evaluation and management by telemedicine. The patient expressed understanding and agreed to proceed.   Review of Systems    Defer to pcp       Objective:    There were no vitals filed for this visit. There is no height or weight on file to calculate BMI.     03/27/2021   10:58 AM 03/12/2021    1:17 PM 11/29/2020   10:28 AM 10/24/2020    8:19 AM 07/24/2020    1:44 PM 04/16/2020    3:32 PM 04/06/2020    9:30 PM  Advanced Directives  Does Patient Have a Medical Advance Directive? No No No No No No No  Would patient like information on creating a medical advance directive? No - Patient declined No - Patient declined No - Patient declined No - Patient declined No - Patient declined No - Patient declined Yes (Inpatient - patient requests chaplain consult to create a medical advance directive)    Current Medications (verified) Outpatient Encounter Medications as of 07/04/2021  Medication Sig   amLODipine (NORVASC) 10 MG tablet Take 1 tablet (10 mg total) by mouth daily.   amoxicillin (AMOXIL) 250 MG capsule Take 3 capsules (750 mg total) by mouth 3 (three) times daily for 14 days.   atorvastatin (LIPITOR) 20 MG tablet Take 1 tablet (20 mg total) by mouth daily.   DULoxetine (CYMBALTA) 30 MG capsule Take 30 mg by mouth daily.   INVOKANA 300 MG TABS tablet TAKE 1 TABLET BY MOUTH  DAILY BEFORE BREAKFAST   levofloxacin (LEVAQUIN) 500 MG tablet Take 1 tablet (500 mg total) by mouth daily for 14 days.   losartan (COZAAR) 25 MG tablet Take 2 tablets (50 mg total) by mouth daily.   metFORMIN (GLUCOPHAGE) 1000 MG tablet Take 1 tablet  (1,000 mg total) by mouth 2 (two) times daily with a meal.   metoprolol succinate (TOPROL-XL) 100 MG 24 hr tablet Take 1 tablet (100 mg total) by mouth daily. Take with or immediately following a meal.   pantoprazole (PROTONIX) 40 MG tablet Take 1 tablet (40 mg total) by mouth 2 (two) times daily for 14 days.   Semaglutide,0.25 or 0.'5MG'$ /DOS, (OZEMPIC, 0.25 OR 0.5 MG/DOSE,) 2 MG/1.5ML SOPN Inject 0.5 mg into the skin once a week. Please start with 0.'25mg'$  weekly for four weeks, if you are tolerating that please increase the dose to 0.'5mg'$    traZODone (DESYREL) 100 MG tablet Take 1 tablet (100 mg total) by mouth at bedtime. Break in half for first week, if still unable to sleep start taking whole pill. (Patient taking differently: Take 50-100 mg by mouth at bedtime as needed for sleep. Break in half for first week, if still unable to sleep start taking whole pill.)   No facility-administered encounter medications on file as of 07/04/2021.    Allergies (verified) Patient has no known allergies.   History: Past Medical History:  Diagnosis Date   Arthritis    COVID-19 04/2020   Diabetes ([place])    GERD (gastroesophageal reflux disease)    Hypertension    Neuropathy    Obesity    Past Surgical History:  Procedure Laterality Date  ABDOMINAL HYSTERECTOMY  2006   BIOPSY  03/27/2021   Procedure: BIOPSY;  Surgeon: Thornton Park, MD;  Location: [facility];  Service: Gastroenterology;;   BREAST REDUCTION SURGERY  2004   COLONOSCOPY WITH PROPOFOL N/A 10/24/2020   Procedure: COLONOSCOPY WITH PROPOFOL;  Surgeon: Thornton Park, MD;  Location: [facility];  Service: Gastroenterology;  Laterality: N/A;   ESOPHAGOGASTRODUODENOSCOPY (EGD) WITH PROPOFOL N/A 03/27/2021   Procedure: ESOPHAGOGASTRODUODENOSCOPY (EGD) WITH PROPOFOL;  Surgeon: Thornton Park, MD;  Location: [facility];  Service: Gastroenterology;  Laterality: N/A;   POLYPECTOMY  10/24/2020   Procedure: POLYPECTOMY;  Surgeon: Thornton Park, MD;  Location: [facility];  Service: Gastroenterology;;   REDUCTION MAMMAPLASTY Bilateral 2004   Family History  Problem Relation Age of Onset   Diabetes Mother    Heart failure Mother    CAD Mother    Kidney disease Mother        on dialysis   Hypertension Mother    Diabetes Sister    Colon cancer Neg Hx    Stomach cancer Neg Hx    Social History   Socioeconomic History   Marital status: Legally Separated    Spouse name: Not on file   Number of children: Not on file   Years of education: Not on file   Highest education level: Not on file  Occupational History   Not on file  Tobacco Use   Smoking status: Former    Types: Cigarettes    Quit date: 03/02/2008    Years since quitting: 13.3   Smokeless tobacco: Never  Vaping Use   Vaping Use: Never used  Substance and Sexual Activity   Alcohol use: Not Currently   Drug use: Never   Sexual activity: Not on file  Other Topics Concern   Not on file  Social History Narrative   Not on file   Social Determinants of Health   Financial Resource Strain: Not on file  Food Insecurity: Not on file  Transportation Needs: Not on file  Physical Activity: Not on file  Stress: Not on file  Social Connections: Not on file    Tobacco Counseling Counseling given: Not Answered   Clinical Intake:                 Diabetic?yes         Activities of Daily Living    03/12/2021    1:16 PM 11/29/2020   12:00 PM  In your present state of health, do you have any difficulty performing the following activities:  Hearing? 0 0  Vision? 0 0  Difficulty concentrating or making decisions? 0 0  Walking or climbing stairs? 0 1  Dressing or bathing? 0 0  Doing errands, shopping? 0 0    Patient Care Team: Riesa Pope, MD as PCP - General  Indicate any recent Medical Services you may have received from other than [facility] providers in the past year (date may be approximate).     Assessment:   This is a  routine wellness examination for [name].  Hearing/Vision screen No results found.  Dietary issues and exercise activities discussed:     Goals Addressed   None   Depression Screen    03/12/2021    2:28 PM 11/29/2020   12:01 PM 04/16/2020    3:32 PM 02/27/2020    1:35 PM 11/28/2019   10:03 AM 10/19/2019    4:40 PM 04/27/2019    4:19 PM  PHQ 2/9 Scores  PHQ - 2 Score 0 0 0  1 3 0 0  PHQ- 9 Score '3   1 5 4 3    '$ Fall Risk    03/12/2021    1:17 PM 11/29/2020   12:00 PM 07/24/2020    1:43 PM 02/27/2020    1:25 PM 11/28/2019    9:06 AM  Fall Risk   Falls in the past year? 0 0 0 0 0  Number falls in past yr: 0 0     Injury with Fall? 0 0     Risk for fall due to : No Fall Risks   Other (Comment)   Risk for fall due to: Comment    back and knee pain   Follow up Falls evaluation completed Falls evaluation completed  Falls prevention discussed     [place]:  Any stairs in or around the home? No  If so, are there any without handrails?  N/A Home free of loose throw rugs in walkways, pet beds, electrical cords, etc? Yes  Adequate lighting in your home to reduce risk of falls? Yes   ASSISTIVE DEVICES UTILIZED TO PREVENT FALLS:  Life alert? No  Use of a cane, walker or w/c? No  Grab bars in the bathroom? Yes  Shower chair or bench in shower? No  Elevated toilet seat or a handicapped toilet? No   TIMED UP AND GO:  Was the test performed? No .  Length of time to ambulate 10 feet: 0 sec.    Cognitive Function:        Immunizations Immunization History  Administered Date(s) Administered   Janssen (J&J) SARS-COV-2 Vaccination 07/24/2019   Moderna Sars-Covid-2 Vaccination 04/28/2020   Tdap 03/12/2021    TDAP status: Up to date  Flu Vaccine status: Declined, Education has been provided regarding the importance of this vaccine but patient still declined. Advised may receive this vaccine at local pharmacy or Health Dept. Aware to provide  a copy of the vaccination record if obtained from local pharmacy or Health Dept. Verbalized acceptance and understanding.  Pneumococcal vaccine status: Due, Education has been provided regarding the importance of this vaccine. Advised may receive this vaccine at local pharmacy or Health Dept. Aware to provide a copy of the vaccination record if obtained from local pharmacy or Health Dept. Verbalized acceptance and understanding.  Covid-19 vaccine status: Completed vaccines  Qualifies for Shingles Vaccine? Yes   Zostavax completed No   Shingrix Completed?: No.    Education has been provided regarding the importance of this vaccine. Patient has been advised to call insurance company to determine out of pocket expense if they have not yet received this vaccine. Advised may also receive vaccine at local pharmacy or Health Dept. Verbalized acceptance and understanding.  Screening Tests Health Maintenance  Topic Date Due   Zoster Vaccines- Shingrix (1 of 2) Never done   PAP SMEAR-Modifier  Never done   FOOT EXAM  12/27/2019   COVID-19 Vaccine (3 - Booster for Janssen series) 06/23/2020   OPHTHALMOLOGY EXAM  06/25/2020   HEMOGLOBIN A1C  09/09/2021   INFLUENZA VACCINE  09/30/2021   MAMMOGRAM  03/01/2023   COLONOSCOPY (Pts 45-59yr Insurance coverage will need to be confirmed)  10/25/2030   TETANUS/TDAP  03/13/2031   Hepatitis C Screening  Completed   HIV Screening  Completed   HPV VACCINES  Aged Out    Health Maintenance  Health Maintenance Due  Topic Date Due   Zoster Vaccines- Shingrix (1 of 2) Never done   PAP  SMEAR-Modifier  Never done   FOOT EXAM  12/27/2019   COVID-19 Vaccine (3 - Booster for Janssen series) 06/23/2020   OPHTHALMOLOGY EXAM  06/25/2020    Colorectal cancer screening: Type of screening: Colonoscopy. Completed 10/24/20. Repeat every due 5 years  Mammogram status: Completed 2. Repeat every year    Lung Cancer Screening: (Low Dose CT Chest recommended if Age  98-80 years, 30 pack-year currently smoking OR have quit w/in 15years.) does qualify.   Lung Cancer Screening Referral: n/a  Additional Screening:  Hepatitis C Screening: does not qualify; Completed 07/24/2020  Vision Screening: Recommended annual ophthalmology exams for early detection of glaucoma and other disorders of the eye. Is the patient up to date with their annual eye exam?  Yes  (will request record) Who is the provider or what is the name of the office in which the patient attends annual eye exams? Dr Katy Fitch If pt is not established with a provider, would they like to be referred to a provider to establish care?  N/A .   Dental Screening: Recommended annual dental exams for proper oral hygiene  Community Resource Referral / Chronic Care Management: CRR required this visit?  No   CCM required this visit?  No      Plan:     I have personally reviewed and noted the following in the patient's chart:   Medical and social history Use of alcohol, tobacco or illicit drugs  Current medications and supplements including opioid prescriptions. Patient is not currently taking opioid prescriptions. Functional ability and status Nutritional status Physical activity Advanced directives List of other physicians Hospitalizations, surgeries, and ER visits in previous 12 months Vitals Screenings to include cognitive, depression, and falls Referrals and appointments  In addition, I have reviewed and discussed with patient certain preventive protocols, quality metrics, and best practice recommendations. A written personalized care plan for preventive services as well as general preventive health recommendations were provided to patient.     Nicoletta Dress, [place]   07/04/2021   Nurse Notes: non face to face  25 minutes  Ms. Cordon , Thank you for taking time to come for your Medicare Wellness Visit. I appreciate your ongoing commitment to your health goals. Please review  the following plan we discussed and let me know if I can assist you in the future.   These are the goals we discussed:  Goals   None     This is a list of the screening recommended for you and due dates:  Health Maintenance  Topic Date Due   Zoster (Shingles) Vaccine (1 of 2) Never done   Pap Smear  Never done   COVID-19 Vaccine (3 - Booster for Janssen series) 06/23/2020   Eye exam for diabetics  06/25/2020   Flu Shot  09/30/2021   Hemoglobin A1C  01/07/2022   Complete foot exam   07/08/2022   Mammogram  03/01/2023   Colon Cancer Screening  10/25/2030   Tetanus Vaccine  03/13/2031   Hepatitis C Screening: USPSTF Recommendation to screen - Ages 18-79 yo.  Completed   HIV Screening  Completed   HPV Vaccine  Aged Out

## 2021-07-07 ENCOUNTER — Encounter: Payer: Self-pay | Admitting: Internal Medicine

## 2021-07-07 ENCOUNTER — Encounter: Payer: Self-pay | Admitting: *Deleted

## 2021-07-07 ENCOUNTER — Ambulatory Visit (INDEPENDENT_AMBULATORY_CARE_PROVIDER_SITE_OTHER): Payer: Medicare Other | Admitting: Internal Medicine

## 2021-07-07 ENCOUNTER — Telehealth: Payer: Self-pay | Admitting: *Deleted

## 2021-07-07 DIAGNOSIS — Z6841 Body Mass Index (BMI) 40.0 and over, adult: Secondary | ICD-10-CM

## 2021-07-07 DIAGNOSIS — Z0000 Encounter for general adult medical examination without abnormal findings: Secondary | ICD-10-CM

## 2021-07-07 DIAGNOSIS — Z87891 Personal history of nicotine dependence: Secondary | ICD-10-CM

## 2021-07-07 DIAGNOSIS — Z7984 Long term (current) use of oral hypoglycemic drugs: Secondary | ICD-10-CM | POA: Diagnosis not present

## 2021-07-07 DIAGNOSIS — E1142 Type 2 diabetes mellitus with diabetic polyneuropathy: Secondary | ICD-10-CM

## 2021-07-07 DIAGNOSIS — I10 Essential (primary) hypertension: Secondary | ICD-10-CM

## 2021-07-07 LAB — GLUCOSE, CAPILLARY: Glucose-Capillary: 135 mg/dL — ABNORMAL HIGH (ref 70–99)

## 2021-07-07 LAB — POCT GLYCOSYLATED HEMOGLOBIN (HGB A1C): Hemoglobin A1C: 7.7 % — AB (ref 4.0–5.6)

## 2021-07-07 MED ORDER — OZEMPIC (0.25 OR 0.5 MG/DOSE) 2 MG/1.5ML ~~LOC~~ SOPN: 0.5000 mg | PEN_INJECTOR | SUBCUTANEOUS | 0 refills | Status: DC

## 2021-07-07 MED ORDER — DULOXETINE HCL 30 MG PO CPEP: 30.0000 mg | ORAL_CAPSULE | Freq: Every day | ORAL | 2 refills | Status: DC

## 2021-07-07 MED ORDER — OZEMPIC (0.25 OR 0.5 MG/DOSE) 2 MG/1.5ML ~~LOC~~ SOPN: 1.0000 mg | PEN_INJECTOR | SUBCUTANEOUS | 0 refills | Status: DC

## 2021-07-07 MED ORDER — OZEMPIC (1 MG/DOSE) 4 MG/3ML ~~LOC~~ SOPN: 1.0000 mg | PEN_INJECTOR | SUBCUTANEOUS | 0 refills | Status: AC

## 2021-07-07 NOTE — Assessment & Plan Note (Addendum)
Pap smear?  She declines at this time. ?

## 2021-07-07 NOTE — Progress Notes (Signed)
? ?CC: Diabetes check, obesity, medication refill ? ?HPI: ? ?[name] is a 61 y.o. female with a past medical history stated below and presents today for CC listed above. Please see problem based assessment and plan for additional details. ? ?Past Medical History:  ?Diagnosis Date  ? Arthritis   ? COVID-19 04/2020  ? Diabetes ([place])   ? GERD (gastroesophageal reflux disease)   ? Hypertension   ? Neuropathy   ? Obesity   ? ? ?Current Outpatient Medications on File Prior to Visit  ?Medication Sig Dispense Refill  ? amLODipine (NORVASC) 10 MG tablet Take 1 tablet (10 mg total) by mouth daily. 90 tablet 3  ? amoxicillin (AMOXIL) 250 MG capsule Take 3 capsules (750 mg total) by mouth 3 (three) times daily for 14 days. 126 capsule 0  ? atorvastatin (LIPITOR) 20 MG tablet Take 1 tablet (20 mg total) by mouth daily. 30 tablet 11  ? INVOKANA 300 MG TABS tablet TAKE 1 TABLET BY MOUTH  DAILY BEFORE BREAKFAST 30 tablet 0  ? levofloxacin (LEVAQUIN) 500 MG tablet Take 1 tablet (500 mg total) by mouth daily for 14 days. 14 tablet 0  ? losartan (COZAAR) 25 MG tablet Take 2 tablets (50 mg total) by mouth daily. 180 tablet 3  ? metFORMIN (GLUCOPHAGE) 1000 MG tablet Take 1 tablet (1,000 mg total) by mouth 2 (two) times daily with a meal. 180 tablet 3  ? metoprolol succinate (TOPROL-XL) 100 MG 24 hr tablet Take 1 tablet (100 mg total) by mouth daily. Take with or immediately following a meal. 90 tablet 3  ? pantoprazole (PROTONIX) 40 MG tablet Take 1 tablet (40 mg total) by mouth 2 (two) times daily for 14 days. 28 tablet 0  ? ?No current facility-administered medications on file prior to visit.  ? ? ?Family History  ?Problem Relation Age of Onset  ? Diabetes Mother   ? Heart failure Mother   ? CAD Mother   ? Kidney disease Mother   ?     on dialysis  ? Hypertension Mother   ? Diabetes Sister   ? Colon cancer Neg Hx   ? Stomach cancer Neg Hx   ? ? ?Social History  ? ?Socioeconomic History  ? Marital status: Legally Separated   ?  Spouse name: Not on file  ? Number of children: Not on file  ? Years of education: Not on file  ? Highest education level: Not on file  ?Occupational History  ? Not on file  ?Tobacco Use  ? Smoking status: Former  ?  Types: Cigarettes  ?  Quit date: 03/02/2008  ?  Years since quitting: 13.3  ? Smokeless tobacco: Never  ?Vaping Use  ? Vaping Use: Never used  ?Substance and Sexual Activity  ? Alcohol use: Not Currently  ? Drug use: Never  ? Sexual activity: Not on file  ?Other Topics Concern  ? Not on file  ?Social History Narrative  ? Not on file  ? ?Social Determinants of Health  ? ?Financial Resource Strain: Low Risk   ? Difficulty of Paying Living Expenses: Not hard at all  ?Food Insecurity: No Food Insecurity  ? Worried About Charity fundraiser in the Last Year: Never true  ? Ran Out of Food in the Last Year: Never true  ?Transportation Needs: No Transportation Needs  ? Lack of Transportation (Medical): No  ? Lack of Transportation (Non-Medical): No  ?Physical Activity: Inactive  ? Days of Exercise per Week: 0  days  ? Minutes of Exercise per Session: 0 min  ?Stress: No Stress Concern Present  ? Feeling of Stress : Not at all  ?Social Connections: Socially Isolated  ? Frequency of Communication with Friends and Family: More than three times a week  ? Frequency of Social Gatherings with Friends and Family: More than three times a week  ? Attends Religious Services: Never  ? Active Member of Clubs or Organizations: No  ? Attends Archivist Meetings: Never  ? Marital Status: Separated  ?Intimate Partner Violence: Not At Risk  ? Fear of Current or Ex-Partner: No  ? Emotionally Abused: No  ? Physically Abused: No  ? Sexually Abused: No  ? ? ?Review of Systems: ?ROS negative except for what is noted on the assessment and plan. ? ?Vitals:  ? 07/07/21 1328 07/07/21 1424  ?BP: (!) 148/76 (!) 135/91  ?Pulse: 89 83  ?Temp: 98.1 ?F (36.7 ?C)   ?TempSrc: Oral   ?SpO2: 99%   ?Weight: (!) 310 lb 8 oz (140.8 kg)    ?Height: '5\' 5"'$  (1.651 m)   ? ? ? ?Physical Exam: ?Constitutional: well-appearing morbidly obese woman sitting in the chair, in no acute distress ?HENT: normocephalic atraumatic, mucous membranes moist ?Eyes: conjunctiva non-erythematous ?Neck: supple ?Cardiovascular: regular rate and rhythm, no m/r/g ?Pulmonary/Chest: normal work of breathing on room air, lungs clear to auscultation bilaterally ?Abdominal: soft, non-tender, non-distended ?MSK: normal bulk and tone ?Neurological: alert & oriented x 3, 5/5 strength in bilateral upper and lower extremities, abnormal gait ?Skin: warm and dry ?Psych: Normal behavior ? ? ?Assessment & Plan:  ? ?See Encounters Tab for problem based charting. ? ?Patient discussed with Dr. Philipp Ovens ?Timothy Lasso, M.D. ?[facility] Internal Medicine, PGY-1 ?Pager: [phone], Phone: [phone] ?Date 07/07/2021 Time 3:22 PM  ?

## 2021-07-07 NOTE — Assessment & Plan Note (Addendum)
Last OV in Jan 2023, A1c was obtained, 9%. At that time, patient was started on Ozempic .'25mg'$  and to continue Invokana '300mg'$  daily and metformin '100mg'$  BID. Has not been taking the Invokoana since Feburary. Her A1c has improved to 7.7%.  Diabetic foot exam obtained today ? ?P: ?-Increase Ozempic from 0.5 mg to Ozempic 1 mg weekly ?

## 2021-07-07 NOTE — Telephone Encounter (Signed)
Call from [facility] requesting clarification on Ozempic rx. N Wanting to know if 0.25 mg rx has been canceled. And start with 1.0 mg? Stated they had received 3 different rxs today. Please call [facility]. ?Thanks ?

## 2021-07-07 NOTE — Assessment & Plan Note (Addendum)
Patient was started on Ozempic 0.'25mg'$  in Jan 2023 and titrated up to 0.5 mg daily. A1c today improved to 7.7% (down from 9% three months ago). She endorse limited exercise due to pain in her lower legs. Diet: consume soda every few weeks, drink juices, fried chicken, pasta. However, she states she is trying to eat better. Her chronic pain, likely due to being obese. She attempts to walk around the neighborhood, about ~15 minutes. Last time she walked was a couple weeks ago. She has gained about 10lbs since jan 2023.  She declines referral to dietitian at this time.  She states "she knows what she needs to do".  We set a goal for her to lose 10 pounds by August.  She is agreeable.  Given Ozempic benefit for weight loss we will titrate up since she is tolerating well. ? ?P: ?-Ozempic 1 mg weekly (titrated from 0.5 mg weekly) ? ?

## 2021-07-07 NOTE — Telephone Encounter (Signed)
Received call from [place] at [facility] requesting clarification on Rx for Ozempic. States they received 2 Rxs for different doses. Please resend. ?

## 2021-07-07 NOTE — Addendum Note (Signed)
Addended by: Timothy Lasso I on: 07/07/2021 03:30 PM ? ? Modules accepted: Orders ? ?

## 2021-07-07 NOTE — Assessment & Plan Note (Addendum)
She is currently on amlodipine 10 mg daily, losartan 50 mg daily, and metoprolol succinate 100 mg daily.  Prior OV, losartan was recently increased from '25mg'$  to '50mg'$  daily.  She states she has been taking the 25 mg and I instructed her to take 2 pills/day to equal 50 mg of the losartan daily.  Otherwise, patient's blood pressure 135/90 on repeat check. Most recent BMP obtained January 2023 showed normal creatinine level and no electrolyte derangement. We will continue current regimen. ?

## 2021-07-07 NOTE — Patient Instructions (Signed)
Thank you, Ms.Sury Wentworth for allowing [place] to provide your care today. Today we discussed diabetes, medication refill, weight loss and diet.   ? ?I have ordered the following labs for you: ? ?Lab Orders    ?     Glucose, capillary    ?     POC Hbg A1C     ? ?Tests ordered today: ? ?None ? ?Referrals ordered today:  ? ?Referral Orders  ?No referral(s) requested today  ?  ? ?I have ordered the following medication/changed the following medications:  ? ?Stop the following medications: ?Medications Discontinued During This Encounter  ?Medication Reason  ? Semaglutide,0.25 or 0.'5MG'$ /DOS, (OZEMPIC, 0.25 OR 0.5 MG/DOSE,) 2 MG/1.5ML SOPN Reorder  ? traZODone (DESYREL) 100 MG tablet   ? DULoxetine (CYMBALTA) 30 MG capsule Reorder  ? Semaglutide,0.25 or 0.'5MG'$ /DOS, (OZEMPIC, 0.25 OR 0.5 MG/DOSE,) 2 MG/1.5ML SOPN   ?  ? ?Start the following medications: ?Meds ordered this encounter  ?Medications  ? DISCONTD: Semaglutide,0.25 or 0.'5MG'$ /DOS, (OZEMPIC, 0.25 OR 0.5 MG/DOSE,) 2 MG/1.5ML SOPN  ?  Sig: Inject 0.5 mg into the skin once a week. Please start with 0.'25mg'$  weekly for four weeks, if you are tolerating that please increase the dose to 0.'5mg'$   ?  Dispense:  4.5 mL  ?  Refill:  0  ? DULoxetine (CYMBALTA) 30 MG capsule  ?  Sig: Take 1 capsule (30 mg total) by mouth daily.  ?  Dispense:  30 capsule  ?  Refill:  2  ? Semaglutide,0.25 or 0.'5MG'$ /DOS, (OZEMPIC, 0.25 OR 0.5 MG/DOSE,) 2 MG/1.5ML SOPN  ?  Sig: Inject 1 mg into the skin once a week.  ?  Dispense:  9 mL  ?  Refill:  0  ?  ? ?Follow up: 1 month  ? ?Remember: Continue taking all medications as directed.  Start taking the Cymbalta daily.  Please discontinue the trazodone as you cannot take it with Cymbalta.  Please take Aleve twice a day for the next 2 weeks to see if there is any improvement in your joint pain.  Continue to work on [employer] and exercise.  Try to lose about 10 pounds before your next office visit. ? ?Should you have any questions or concerns please call the  internal medicine clinic at [phone].   ? ?Timothy Lasso, MD ?[facility] ? ? ?

## 2021-07-11 NOTE — Progress Notes (Signed)
Internal Medicine Clinic Attending ? ?Case discussed with Dr. Ariwodo  At the time of the visit.  We reviewed the resident?s history and exam and pertinent patient test results.  I agree with the assessment, diagnosis, and plan of care documented in the resident?s note.  ?

## 2021-07-15 ENCOUNTER — Telehealth: Payer: Self-pay | Admitting: Gastroenterology

## 2021-07-15 NOTE — Telephone Encounter (Signed)
We received a call from patient. Patient wants to know when we want her back for stool sample? Please advise. ?

## 2021-07-15 NOTE — Telephone Encounter (Signed)
Returned pt call. Advised her sample is not due until on or after 08/01/21. Verbalized acceptance and understanding. ?

## 2021-07-31 ENCOUNTER — Other Ambulatory Visit: Payer: Medicare Other

## 2021-07-31 DIAGNOSIS — A048 Other specified bacterial intestinal infections: Secondary | ICD-10-CM

## 2021-08-02 LAB — HELICOBACTER PYLORI  SPECIAL ANTIGEN
MICRO NUMBER:: 13470695
SPECIMEN QUALITY: ADEQUATE

## 2021-08-04 ENCOUNTER — Encounter: Payer: Self-pay | Admitting: Gastroenterology

## 2021-08-05 ENCOUNTER — Encounter: Payer: Self-pay | Admitting: Gastroenterology

## 2021-08-05 ENCOUNTER — Ambulatory Visit (INDEPENDENT_AMBULATORY_CARE_PROVIDER_SITE_OTHER): Payer: Medicare Other | Admitting: Gastroenterology

## 2021-08-05 VITALS — BP 130/80 | HR 68 | Ht 65.0 in | Wt 311.0 lb

## 2021-08-05 DIAGNOSIS — R110 Nausea: Secondary | ICD-10-CM | POA: Diagnosis not present

## 2021-08-05 DIAGNOSIS — A048 Other specified bacterial intestinal infections: Secondary | ICD-10-CM | POA: Diagnosis not present

## 2021-08-05 DIAGNOSIS — K219 Gastro-esophageal reflux disease without esophagitis: Secondary | ICD-10-CM

## 2021-08-05 NOTE — Progress Notes (Signed)
Referring Provider: Riesa Pope, * Primary Care Physician:  Riesa Pope, MD  Chief complaint:  H pylori gastritis   IMPRESSION:  H pylori gastritis    - no response to tetracycline-based quadruple therapy    - resolution of symptoms with Levofloxacin, amoxicillin, and pantoprazole    -Complete resolution of GI symptoms with eradication of H. pylori Reflux esophagitis presenting with reflux and nausea     - no alarm features, although unusual acute onset of symptoms    -Symptoms resolved with eradication of H. pylori Tubular adenoma and 2 hyperplastic polyps on colonoscopy 10/25/20    - surveillance colonoscopy recommended in 5 years given the family history FIT+ without overt bleeding with colonoscopy 10/25/20 Family history of colon polyps (sister)  PLAN: - Avoid all NSAIDs - Surveillance colonoscopy 2027, earlier with new symptoms - Follow-up PRN  Please see the "Patient Instructions" section for addition details about the plan.   HPI: Ruth Blackburn is a 61 y.o. female who was recently seen for heartburn, nausea, and atypical chest pain.    EGD 03/27/2021 showed reflux esophagitis without eosinophilic esophagitis and H. pylori gastritis.  Follow-up testing after completing tetracycline, metronidazole, Pepto-Bismol, and amoxicillin was positive.  She was subsequently successfully treated with levofloxacin, amoxicillin, and pantoprazole for 14 days.  Follow-up H. pylori stool antigen testing was negative. GI ROS is negative except for intermittent eructation.  She has no new complaints or concerns at this time.   Endoscopic evaluation: - Colonoscopy 10/24/20 for positive FIT: non-bleeding internal hemorrhoids, two 1 to 2 mm hyperplastic polyps in the rectum, and one 6 mm tubular in the distal transverse colon, removed with a cold snare. - EGD 03/27/2021 showed reflux esophagitis without eosinophilic esophagitis and H. pylori gastritis.   Past Medical  History:  Diagnosis Date   Arthritis    COVID-19 04/2020   Diabetes ([place])    GERD (gastroesophageal reflux disease)    H. pylori infection    Hypertension    Neuropathy    Obesity     Past Surgical History:  Procedure Laterality Date   ABDOMINAL HYSTERECTOMY  2006   BIOPSY  03/27/2021   Procedure: BIOPSY;  Surgeon: Thornton Park, MD;  Location: [facility];  Service: Gastroenterology;;   BREAST REDUCTION SURGERY  2004   COLONOSCOPY WITH PROPOFOL N/A 10/24/2020   Procedure: COLONOSCOPY WITH PROPOFOL;  Surgeon: Thornton Park, MD;  Location: [facility];  Service: Gastroenterology;  Laterality: N/A;   ESOPHAGOGASTRODUODENOSCOPY (EGD) WITH PROPOFOL N/A 03/27/2021   Procedure: ESOPHAGOGASTRODUODENOSCOPY (EGD) WITH PROPOFOL;  Surgeon: Thornton Park, MD;  Location: [facility];  Service: Gastroenterology;  Laterality: N/A;   POLYPECTOMY  10/24/2020   Procedure: POLYPECTOMY;  Surgeon: Thornton Park, MD;  Location: [facility];  Service: Gastroenterology;;   REDUCTION MAMMAPLASTY Bilateral 2004    Current Outpatient Medications  Medication Sig Dispense Refill   amLODipine (NORVASC) 10 MG tablet Take 1 tablet (10 mg total) by mouth daily. 90 tablet 3   atorvastatin (LIPITOR) 20 MG tablet Take 1 tablet (20 mg total) by mouth daily. 30 tablet 11   DULoxetine (CYMBALTA) 30 MG capsule Take 1 capsule (30 mg total) by mouth daily. 30 capsule 2   losartan (COZAAR) 25 MG tablet Take 2 tablets (50 mg total) by mouth daily. 180 tablet 3   metFORMIN (GLUCOPHAGE) 1000 MG tablet Take 1 tablet (1,000 mg total) by mouth 2 (two) times daily with a meal. 180 tablet 3   metoprolol succinate (TOPROL-XL) 100 MG 24 hr tablet Take 1  tablet (100 mg total) by mouth daily. Take with or immediately following a meal. 90 tablet 3   Semaglutide, 1 MG/DOSE, (OZEMPIC, 1 MG/DOSE,) 4 MG/3ML SOPN Inject 1 mg into the skin once a week. 9 mL 0   No current facility-administered medications for this visit.     Allergies as of 08/05/2021   (No Known Allergies)    Family History  Problem Relation Age of Onset   Diabetes Mother    Heart failure Mother    CAD Mother    Kidney disease Mother        on dialysis   Hypertension Mother    Diabetes Sister    Colon cancer Neg Hx    Stomach cancer Neg Hx     Physical Exam: General:   Alert,  well-nourished, pleasant and cooperative in NAD Head:  Normocephalic and atraumatic. Eyes:  Sclera clear, no icterus.   Conjunctiva pink. Abdomen:  Soft, central obesity, nontender, nondistended, normal bowel sounds, no rebound or guarding. No hepatosplenomegaly. I am unable to reproduce her symptoms on exam.    Neurologic:  Alert and  oriented x4;  grossly nonfocal Skin:  Intact without significant lesions or rashes. Psych:  Alert and cooperative. Normal mood and affect.     Linder Prajapati L. Tarri Glenn, MD, MPH 08/05/2021, 5:02 PM

## 2021-08-05 NOTE — Patient Instructions (Signed)
Colonoscopy due 2027.  Follow up as needed.

## 2021-08-27 NOTE — Progress Notes (Signed)
Internal Medicine Clinic Attending  I reviewed the AWV findings.  I agree with the assessment, diagnosis, and plan of care documented in the AWV note.     

## 2021-09-04 ENCOUNTER — Other Ambulatory Visit: Payer: Self-pay | Admitting: Student

## 2021-09-04 DIAGNOSIS — I10 Essential (primary) hypertension: Secondary | ICD-10-CM

## 2022-01-02 ENCOUNTER — Ambulatory Visit (INDEPENDENT_AMBULATORY_CARE_PROVIDER_SITE_OTHER): Payer: Medicare Other | Admitting: Podiatry

## 2022-01-02 ENCOUNTER — Ambulatory Visit (INDEPENDENT_AMBULATORY_CARE_PROVIDER_SITE_OTHER): Payer: Medicare Other

## 2022-01-02 DIAGNOSIS — E1149 Type 2 diabetes mellitus with other diabetic neurological complication: Secondary | ICD-10-CM

## 2022-01-02 DIAGNOSIS — M2042 Other hammer toe(s) (acquired), left foot: Secondary | ICD-10-CM

## 2022-01-02 DIAGNOSIS — L84 Corns and callosities: Secondary | ICD-10-CM

## 2022-01-02 DIAGNOSIS — M2041 Other hammer toe(s) (acquired), right foot: Secondary | ICD-10-CM | POA: Diagnosis not present

## 2022-01-02 DIAGNOSIS — M2141 Flat foot [pes planus] (acquired), right foot: Secondary | ICD-10-CM

## 2022-01-02 DIAGNOSIS — M722 Plantar fascial fibromatosis: Secondary | ICD-10-CM

## 2022-01-02 DIAGNOSIS — M79672 Pain in left foot: Secondary | ICD-10-CM | POA: Diagnosis not present

## 2022-01-02 DIAGNOSIS — M79671 Pain in right foot: Secondary | ICD-10-CM | POA: Diagnosis not present

## 2022-01-02 DIAGNOSIS — M2142 Flat foot [pes planus] (acquired), left foot: Secondary | ICD-10-CM

## 2022-01-02 NOTE — Progress Notes (Unsigned)
Subjective: Chief Complaint  Patient presents with   Diabetes    Diabetic foot care, A1c-7.7 BG- not taking, Nail trim, asking about Diabetic shoes and inserts    61 year old female presents the above concerns.  She is interested to get diabetic shoes as she thinks will be helpful for her chronic heel pain she been having.  She thinks she is better support will be helpful.  She denies any open lesions.  No recent injuries or changes.  Objective: AAO x3, NAD DP/PT pulses palpable bilaterally, CRT less than 3 seconds Clips of been diagnosed with neuropathy.  Sensations intact but she is experiencing numbness and burning tingling. Nails are mildly hypertrophic, dystrophic and elongated but not causing significant pain.  No edema, erythema or signs of infection.  Small blister corns are present along the lateral aspect the fifth toes adjacent to the toenail.  No edema, erythema.  Adductovarus/hammertoes noted mostly the fifth toe.  Decreased medial arch upon weightbearing.  Not able to appreciate significant pain on the course or insertion of plantar fascia today but she still experiences symptoms.  No area pinpoint tenderness.  MMT 5/5. No pain with calf compression, swelling, warmth, erythema  Assessment: 61 year old female with type 2 diabetes with neuropathy  Plan: -All treatment options discussed with the patient including all alternatives, risks, complications.  -As a courtesy sharply debrided the toenails x10 without any complications or bleeding they were minimally elongated. -I do think she will benefit from diabetic shoes.  Prescription for diabetic shoes, inserts was provided to the patient. -Daily foot inspection. -Sharply debrided the calluses x2 to fifth toes with any complications or bleeding -Patient encouraged to call the office with any questions, concerns, change in symptoms.   Trula Slade DPM

## 2022-01-02 NOTE — Patient Instructions (Signed)

## 2022-01-15 ENCOUNTER — Other Ambulatory Visit: Payer: Self-pay

## 2022-01-15 DIAGNOSIS — Z1231 Encounter for screening mammogram for malignant neoplasm of breast: Secondary | ICD-10-CM

## 2022-01-26 ENCOUNTER — Other Ambulatory Visit: Payer: Self-pay | Admitting: Podiatry

## 2022-01-26 DIAGNOSIS — M2141 Flat foot [pes planus] (acquired), right foot: Secondary | ICD-10-CM

## 2022-01-26 DIAGNOSIS — M79671 Pain in right foot: Secondary | ICD-10-CM

## 2022-01-26 DIAGNOSIS — M2041 Other hammer toe(s) (acquired), right foot: Secondary | ICD-10-CM

## 2022-01-26 DIAGNOSIS — E1149 Type 2 diabetes mellitus with other diabetic neurological complication: Secondary | ICD-10-CM

## 2022-01-26 DIAGNOSIS — M722 Plantar fascial fibromatosis: Secondary | ICD-10-CM

## 2022-01-26 DIAGNOSIS — L84 Corns and callosities: Secondary | ICD-10-CM

## 2022-02-19 IMAGING — MG DIGITAL SCREENING BILAT W/ TOMO W/ CAD
6 of 10 series · 6 of 30 positions shown · non-contrast
Comparison: Previous exam(s).

ACR Breast Density Category a: The breast tissue is almost entirely
fatty.

CLINICAL DATA: Screening.

EXAM:
DIGITAL SCREENING BILATERAL MAMMOGRAM WITH TOMO AND CAD

[L CC synth-2D (1 of 2)]
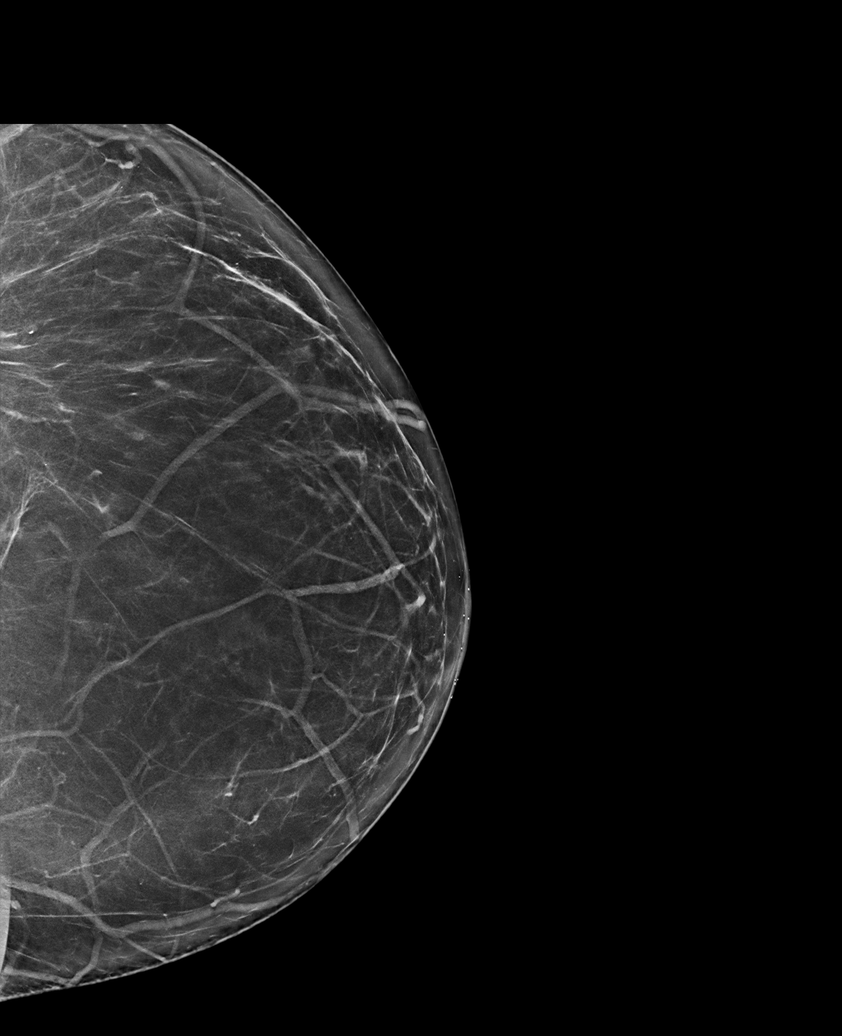

[L CC synth-2D (2 of 2)]
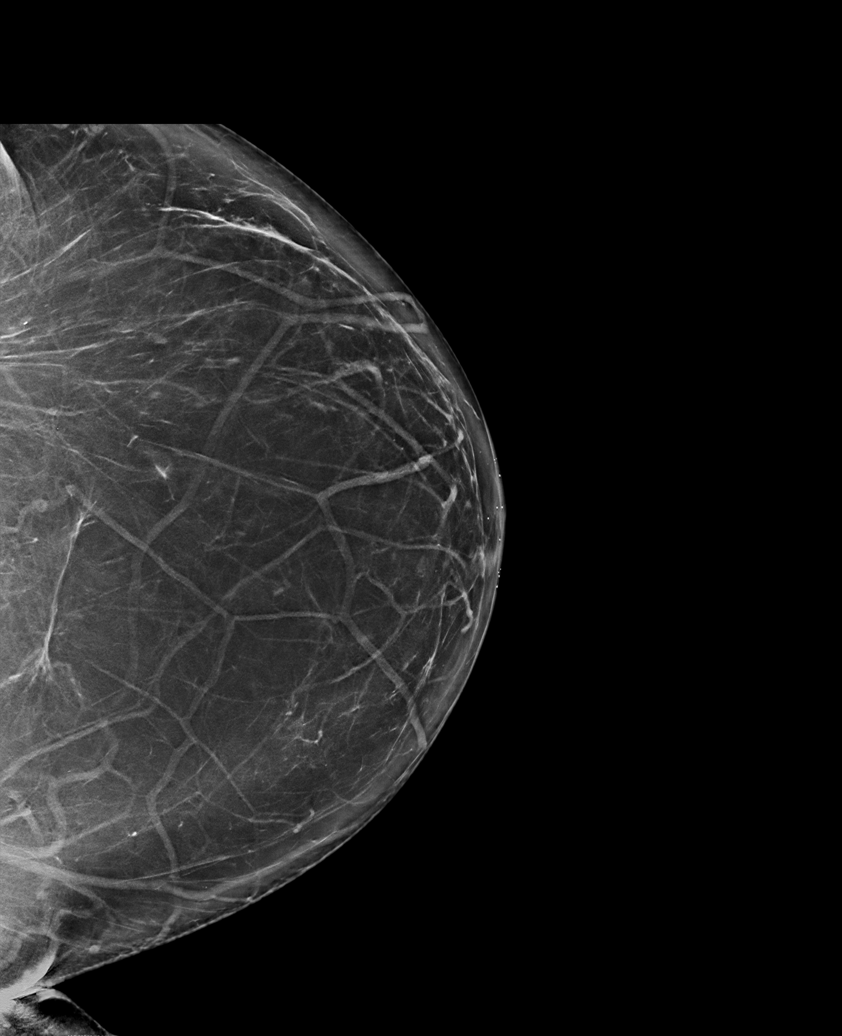

[R CC synth-2D]
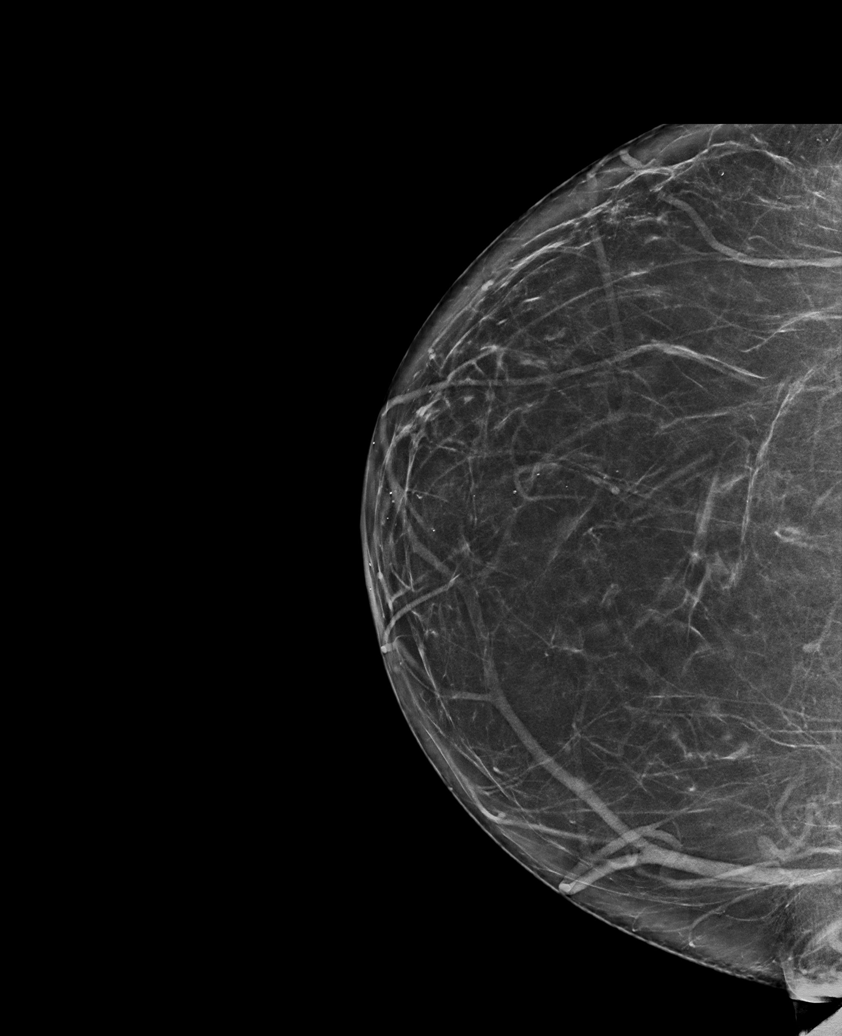

[R MLO synth-2D]
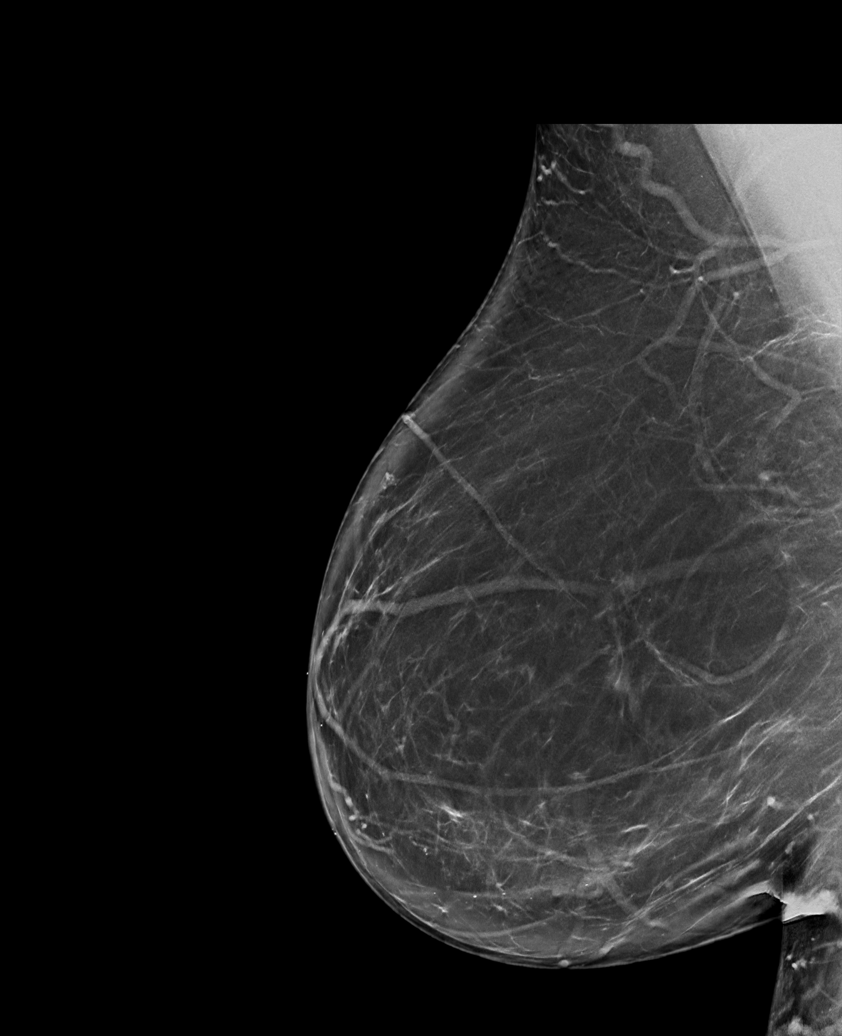

[L MLO synth-2D]
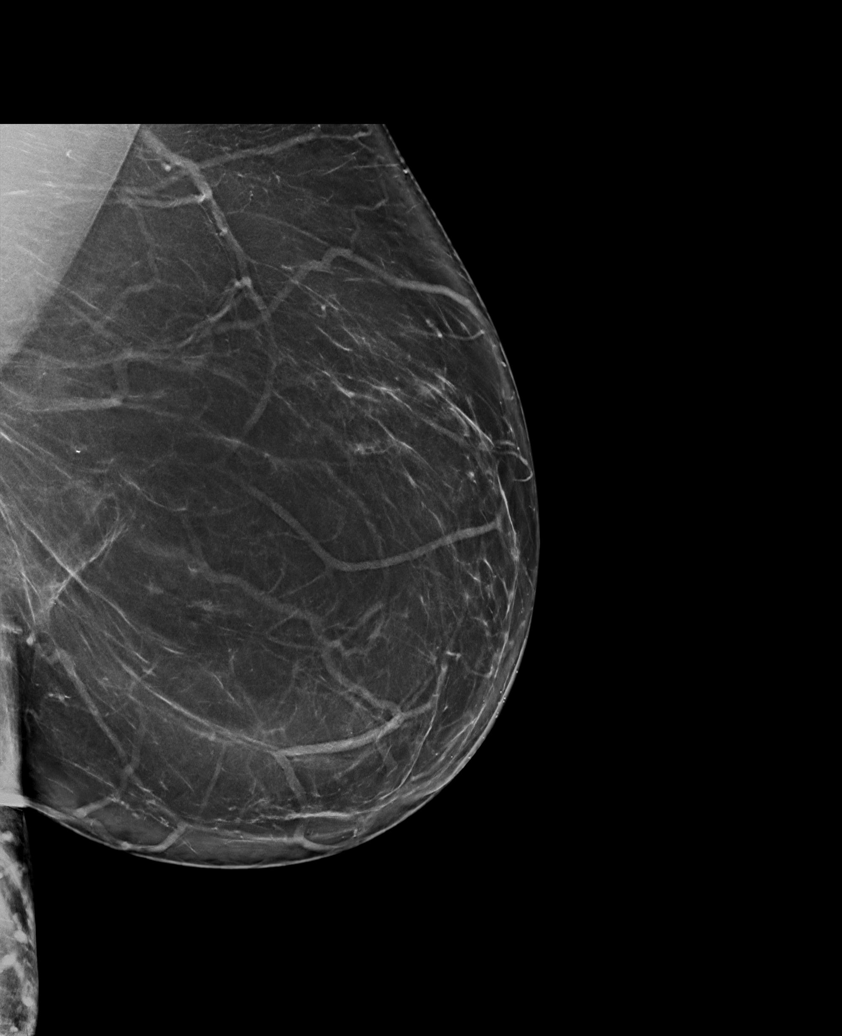

[L CC tomo · tomo slice 43/85.0]
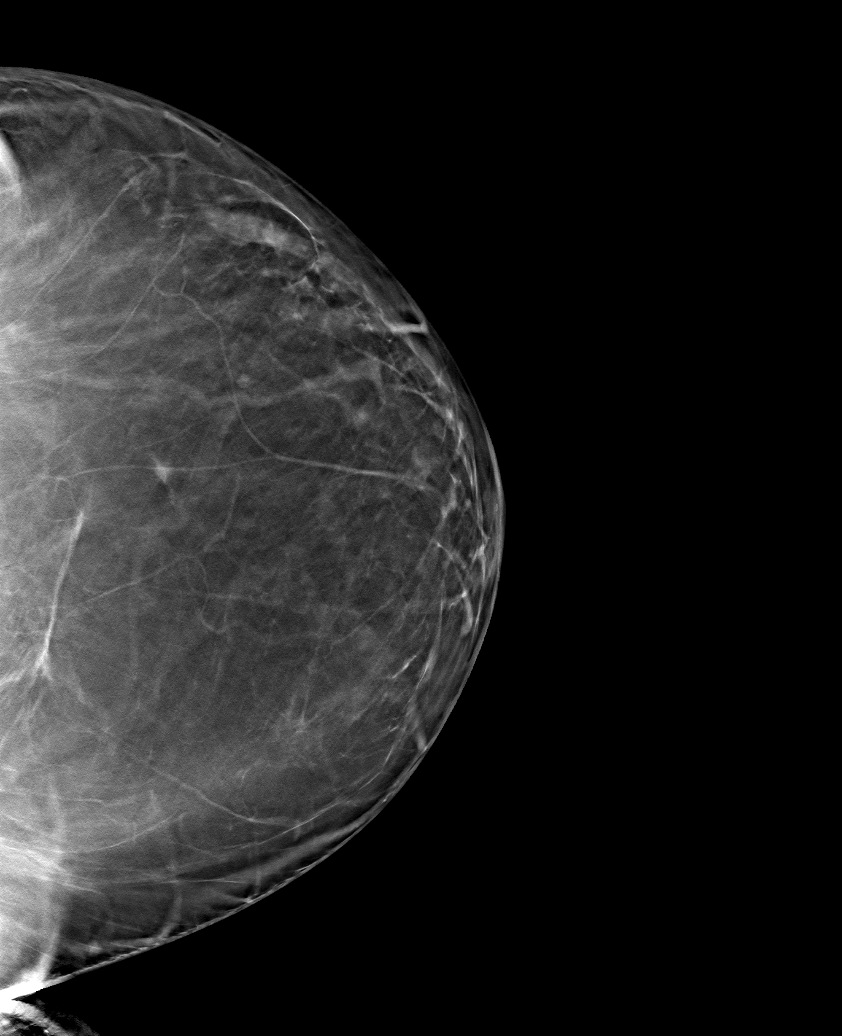

[6 of 30 positions shown; findings below may reference images not displayed]

FINDINGS: There are no findings suspicious for malignancy. Images were
processed with CAD.
IMPRESSION: No mammographic evidence of malignancy. A result letter of this
screening mammogram will be mailed directly to the patient.

RECOMMENDATION:
Screening mammogram in one year. (Code:8Y-[id number])

BI-RADS CATEGORY  1: Negative.

## 2022-03-18 ENCOUNTER — Ambulatory Visit

## 2022-04-07 ENCOUNTER — Ambulatory Visit
Admission: RE | Admit: 2022-04-07 | Discharge: 2022-04-07 | Disposition: A | Discharge status: Home / Self Care | Payer: 59 | Source: Ambulatory Visit

## 2022-04-07 DIAGNOSIS — Z1231 Encounter for screening mammogram for malignant neoplasm of breast: Secondary | ICD-10-CM

## 2022-06-03 ENCOUNTER — Other Ambulatory Visit: Payer: Self-pay | Admitting: Student

## 2022-06-03 DIAGNOSIS — E1142 Type 2 diabetes mellitus with diabetic polyneuropathy: Secondary | ICD-10-CM

## 2022-06-04 NOTE — Addendum Note (Signed)
Addended by: Judyann Munson on: 06/04/2022 10:30 AM   Modules accepted: Orders

## 2022-06-04 NOTE — Telephone Encounter (Signed)
LOV 06/2021. I called pt - no answer; left message to call the office to schedule an appt. Atorvastatin is not on current med list.

## 2022-06-04 NOTE — Telephone Encounter (Signed)
Pt returned that missed call stated she dos not know why  anyone here would call her due to she has transferred care ... I checked and explained her pharmacy had sent  a  RX refill over here and the RN was calling  her she .Marland Kitchen Then I asked when was her offical  last date of service here she stated last year but she does not remember the date  to I place a note of her snap chat page  as not being a pt

## 2022-08-15 ENCOUNTER — Other Ambulatory Visit: Payer: Self-pay | Admitting: Student

## 2022-08-15 DIAGNOSIS — E1142 Type 2 diabetes mellitus with diabetic polyneuropathy: Secondary | ICD-10-CM

## 2022-08-18 ENCOUNTER — Telehealth: Payer: Self-pay

## 2022-08-18 MED ORDER — ATORVASTATIN CALCIUM 20 MG PO TABS: 20.0000 mg | ORAL_TABLET | Freq: Every day | ORAL | 1 refills | Status: AC

## 2022-08-18 NOTE — Telephone Encounter (Signed)
Will refill atorvastatin, can we please call Ruth Blackburn and have her follow up in the clinic? She is due for a diabetes, HTN follow up.

## 2022-08-18 NOTE — Telephone Encounter (Signed)
In permament comments on the appt desk screen states she is no longer a patient effective 2023.  After reviewing her chart, it looks like she is receiving primary care from Linus Galas A-GNP at [facility].  Her last office with them was on 07/10/2022.  No future appointment scheduled.  Forwarding back to Dr. Evie Lacks and triage pool to make them aware.

## 2022-08-18 NOTE — Telephone Encounter (Signed)
Prior Authorization for patient (Atorvastatin) came through on cover my meds was submitted with last office notes awaiting approval or denial

## 2022-08-19 NOTE — Telephone Encounter (Signed)
Ms. Haldane did not pick up when called, but I spoke with [facility] pharmacy who will cancel Atorvastatin prescription and send new request to patient's new PCP Linus Galas [facility]

## 2023-03-04 ENCOUNTER — Other Ambulatory Visit: Payer: Self-pay | Admitting: Nurse Practitioner

## 2023-03-04 DIAGNOSIS — Z1231 Encounter for screening mammogram for malignant neoplasm of breast: Secondary | ICD-10-CM

## 2023-04-05 ENCOUNTER — Encounter: Payer: Self-pay | Admitting: Podiatry

## 2023-04-05 ENCOUNTER — Ambulatory Visit (INDEPENDENT_AMBULATORY_CARE_PROVIDER_SITE_OTHER): Payer: 59

## 2023-04-05 ENCOUNTER — Ambulatory Visit (INDEPENDENT_AMBULATORY_CARE_PROVIDER_SITE_OTHER): Payer: 59 | Admitting: Podiatry

## 2023-04-05 DIAGNOSIS — M79671 Pain in right foot: Secondary | ICD-10-CM | POA: Diagnosis not present

## 2023-04-05 DIAGNOSIS — E119 Type 2 diabetes mellitus without complications: Secondary | ICD-10-CM | POA: Diagnosis not present

## 2023-04-05 DIAGNOSIS — Q666 Other congenital valgus deformities of feet: Secondary | ICD-10-CM | POA: Diagnosis not present

## 2023-04-05 DIAGNOSIS — M79672 Pain in left foot: Secondary | ICD-10-CM | POA: Diagnosis not present

## 2023-04-05 NOTE — Patient Instructions (Signed)

## 2023-04-05 NOTE — Progress Notes (Signed)
Subjective: Chief Complaint  Patient presents with   [facility]    RM#[facility]  patient states no concerns today.   63 year old female presents the office with above concerns.  She presents today for yearly diabetic foot exam.  States that she is doing well.  She does not report any ulcerations.  No numbness or tingling.  No significant pain at this time.  She is currently getting injections in her back and she is scheduled another 1 tomorrow.  The last A1c that she reports is 5.9.  She is not sugar regularly at home.   Objective: AAO x3, NAD DP/PT pulses palpable bilaterally, CRT less than 3 seconds Sensation intact with Semmes Weinstein monofilament. Nails are mildly elongated but no significant pain or discoloration. No open lesions are identified.  No callus formation. I am not able to appreciate any areas.  No edema.  Flexor, extensor tendons are intact.  MMT 5/5.  Flatfoot present. No pain with calf compression, swelling, warmth, erythema  Assessment: Diabetic foot exam, doing well  Plan: -All treatment options discussed with the patient including all alternatives, risks, complications.  -X-rays obtained and reviewed at the patient's request.  3 views were obtained bilaterally.  No evidence of acute fracture.  Mild joint space narrowing on the Lisfranc on the left side on the third metatarsal cuneiform joint and mildly along the right midfoot.  Decreased calcaneal with any single. -As a courtesy debride the nails to any complications or bleeding. -Discussed supportive shoe gear. -Daily foot inspection and continue good glucose control. -Patient encouraged to call the office with any questions, concerns, change in symptoms.   Return in about 1 year (around 04/04/2024).  Vivi Barrack DPM

## 2023-04-13 ENCOUNTER — Ambulatory Visit
Admission: RE | Admit: 2023-04-13 | Discharge: 2023-04-13 | Disposition: A | Discharge status: Home / Self Care | Payer: 59 | Source: Ambulatory Visit | Attending: Nurse Practitioner | Admitting: Nurse Practitioner

## 2023-04-13 DIAGNOSIS — Z1231 Encounter for screening mammogram for malignant neoplasm of breast: Secondary | ICD-10-CM

## 2023-08-31 ENCOUNTER — Other Ambulatory Visit (HOSPITAL_COMMUNITY): Payer: Self-pay | Admitting: *Deleted

## 2023-08-31 DIAGNOSIS — R072 Precordial pain: Secondary | ICD-10-CM

## 2023-09-17 ENCOUNTER — Telehealth (HOSPITAL_COMMUNITY): Payer: Self-pay | Admitting: *Deleted

## 2023-09-17 MED ORDER — METOPROLOL TARTRATE 50 MG PO TABS: ORAL_TABLET | ORAL | 0 refills | Status: AC

## 2023-09-17 NOTE — Telephone Encounter (Signed)
 Reaching out to patient to offer assistance regarding upcoming cardiac imaging study; pt verbalizes understanding of appt date/time, parking situation and where to check in, pre-test NPO status and medications ordered, and verified current allergies; name and call back number provided for further questions should they arise Johney Frame RN Navigator Cardiac Imaging [facility] [phone] office [phone] cell

## 2023-09-20 ENCOUNTER — Ambulatory Visit (HOSPITAL_COMMUNITY)
Admission: RE | Admit: 2023-09-20 | Discharge: 2023-09-20 | Disposition: A | Discharge status: Home / Self Care | Source: Ambulatory Visit | Attending: Internal Medicine

## 2023-09-20 DIAGNOSIS — I517 Cardiomegaly: Secondary | ICD-10-CM | POA: Diagnosis not present

## 2023-09-20 DIAGNOSIS — R072 Precordial pain: Secondary | ICD-10-CM | POA: Diagnosis present

## 2023-09-20 LAB — POCT I-STAT CREATININE: Creatinine, Ser: 0.7 mg/dL (ref 0.44–1.00)

## 2023-09-20 MED ORDER — NITROGLYCERIN 0.4 MG SL SUBL
0.8000 mg | SUBLINGUAL_TABLET | Freq: Once | SUBLINGUAL | Status: AC
  Administered 2023-09-20: 0.8 mg via SUBLINGUAL

## 2023-09-20 MED ORDER — IOHEXOL 350 MG/ML SOLN
100.0000 mL | Freq: Once | INTRAVENOUS | Status: AC | PRN
  Administered 2023-09-20: 100 mL via INTRAVENOUS

## 2024-04-04 ENCOUNTER — Other Ambulatory Visit: Payer: Self-pay | Admitting: Physician Assistant

## 2024-04-04 DIAGNOSIS — Z1231 Encounter for screening mammogram for malignant neoplasm of breast: Secondary | ICD-10-CM

## 2024-04-24 ENCOUNTER — Ambulatory Visit

## 2024-05-01 ENCOUNTER — Ambulatory Visit: Admitting: Podiatry
